# Patient Record
Sex: Female | Born: 1965 | Race: Black or African American | Hispanic: No | Marital: Single | Smoking: Former smoker
Health system: Southern US, Community
[De-identification: ages and names within clinical notes are randomized; demographics above are authoritative.]

## PROBLEM LIST (undated history)

## (undated) DIAGNOSIS — F329 Major depressive disorder, single episode, unspecified: Secondary | ICD-10-CM

## (undated) DIAGNOSIS — K5900 Constipation, unspecified: Secondary | ICD-10-CM

## (undated) DIAGNOSIS — F32A Depression, unspecified: Secondary | ICD-10-CM

## (undated) DIAGNOSIS — F419 Anxiety disorder, unspecified: Secondary | ICD-10-CM

## (undated) HISTORY — DX: Anxiety disorder, unspecified: F41.9

## (undated) HISTORY — DX: Constipation, unspecified: K59.00

## (undated) HISTORY — PX: EYE SURGERY: SHX253

## (undated) HISTORY — PX: TUBAL LIGATION: SHX77

## (undated) HISTORY — PX: ENDOMETRIAL ABLATION: SHX621

## (undated) HISTORY — DX: Depression, unspecified: F32.A

## (undated) HISTORY — DX: Major depressive disorder, single episode, unspecified: F32.9

---

## 1998-08-07 ENCOUNTER — Encounter (HOSPITAL_COMMUNITY): Admission: RE | Admit: 1998-08-07 | Discharge: 1998-09-03 | Payer: Self-pay | Admitting: Obstetrics & Gynecology

## 1998-08-19 ENCOUNTER — Encounter: Payer: Self-pay | Admitting: Obstetrics & Gynecology

## 1998-09-01 ENCOUNTER — Inpatient Hospital Stay (HOSPITAL_COMMUNITY): Admission: AD | Admit: 1998-09-01 | Discharge: 1998-09-04 | Payer: Self-pay | Admitting: Obstetrics & Gynecology

## 1998-09-10 ENCOUNTER — Encounter (HOSPITAL_COMMUNITY): Admission: RE | Admit: 1998-09-10 | Discharge: 1998-12-09 | Payer: Self-pay | Admitting: Obstetrics & Gynecology

## 1998-11-04 ENCOUNTER — Ambulatory Visit (HOSPITAL_COMMUNITY): Admission: RE | Admit: 1998-11-04 | Discharge: 1998-11-04 | Payer: Self-pay | Admitting: Obstetrics & Gynecology

## 1999-04-16 ENCOUNTER — Encounter: Admission: RE | Admit: 1999-04-16 | Discharge: 1999-04-16 | Payer: Self-pay | Admitting: Obstetrics and Gynecology

## 1999-04-16 ENCOUNTER — Encounter: Payer: Self-pay | Admitting: Obstetrics and Gynecology

## 1999-04-16 ENCOUNTER — Encounter (INDEPENDENT_AMBULATORY_CARE_PROVIDER_SITE_OTHER): Payer: Self-pay | Admitting: *Deleted

## 2000-01-14 ENCOUNTER — Other Ambulatory Visit: Admission: RE | Admit: 2000-01-14 | Discharge: 2000-01-14 | Payer: Self-pay | Admitting: Obstetrics and Gynecology

## 2000-09-30 ENCOUNTER — Ambulatory Visit (HOSPITAL_BASED_OUTPATIENT_CLINIC_OR_DEPARTMENT_OTHER): Admission: RE | Admit: 2000-09-30 | Discharge: 2000-09-30 | Payer: Self-pay | Admitting: Ophthalmology

## 2002-08-03 ENCOUNTER — Other Ambulatory Visit: Admission: RE | Admit: 2002-08-03 | Discharge: 2002-08-03 | Payer: Self-pay | Admitting: Obstetrics and Gynecology

## 2003-12-31 ENCOUNTER — Other Ambulatory Visit: Admission: RE | Admit: 2003-12-31 | Discharge: 2003-12-31 | Payer: Self-pay | Admitting: Obstetrics and Gynecology

## 2004-03-13 ENCOUNTER — Encounter (INDEPENDENT_AMBULATORY_CARE_PROVIDER_SITE_OTHER): Payer: Self-pay | Admitting: Specialist

## 2004-03-13 ENCOUNTER — Ambulatory Visit (HOSPITAL_COMMUNITY): Admission: RE | Admit: 2004-03-13 | Discharge: 2004-03-13 | Payer: Self-pay | Admitting: Obstetrics and Gynecology

## 2005-03-30 ENCOUNTER — Other Ambulatory Visit: Admission: RE | Admit: 2005-03-30 | Discharge: 2005-03-30 | Payer: Self-pay | Admitting: Obstetrics and Gynecology

## 2008-07-01 ENCOUNTER — Emergency Department (HOSPITAL_BASED_OUTPATIENT_CLINIC_OR_DEPARTMENT_OTHER): Admission: EM | Admit: 2008-07-01 | Discharge: 2008-07-01 | Payer: Self-pay | Admitting: Emergency Medicine

## 2008-10-29 ENCOUNTER — Encounter (INDEPENDENT_AMBULATORY_CARE_PROVIDER_SITE_OTHER): Payer: Self-pay | Admitting: *Deleted

## 2008-12-13 ENCOUNTER — Ambulatory Visit: Payer: Self-pay | Admitting: Family Medicine

## 2008-12-13 DIAGNOSIS — R0989 Other specified symptoms and signs involving the circulatory and respiratory systems: Secondary | ICD-10-CM | POA: Insufficient documentation

## 2008-12-13 DIAGNOSIS — K625 Hemorrhage of anus and rectum: Secondary | ICD-10-CM | POA: Insufficient documentation

## 2008-12-13 DIAGNOSIS — K5900 Constipation, unspecified: Secondary | ICD-10-CM | POA: Insufficient documentation

## 2008-12-13 DIAGNOSIS — R0609 Other forms of dyspnea: Secondary | ICD-10-CM | POA: Insufficient documentation

## 2008-12-15 DIAGNOSIS — F341 Dysthymic disorder: Secondary | ICD-10-CM | POA: Insufficient documentation

## 2008-12-17 ENCOUNTER — Telehealth: Payer: Self-pay | Admitting: Family Medicine

## 2008-12-17 LAB — CONVERTED CEMR LAB
ALT: 21 units/L (ref 0–35)
AST: 19 units/L (ref 0–37)
Albumin: 4.2 g/dL (ref 3.5–5.2)
Alkaline Phosphatase: 87 units/L (ref 39–117)
BUN: 12 mg/dL (ref 6–23)
Basophils Absolute: 0 10*3/uL (ref 0.0–0.1)
Basophils Relative: 0 % (ref 0–1)
Bilirubin, Direct: 0.1 mg/dL (ref 0.0–0.3)
CO2: 23 meq/L (ref 19–32)
Calcium: 9.1 mg/dL (ref 8.4–10.5)
Chloride: 104 meq/L (ref 96–112)
Cholesterol: 173 mg/dL (ref 0–200)
Creatinine, Ser: 0.85 mg/dL (ref 0.40–1.20)
Eosinophils Absolute: 0.1 10*3/uL (ref 0.0–0.7)
Eosinophils Relative: 2 % (ref 0–5)
Glucose, Bld: 81 mg/dL (ref 70–99)
HCT: 38.9 % (ref 36.0–46.0)
HDL: 68 mg/dL (ref >39)
Hemoglobin: 12.5 g/dL (ref 12.0–15.0)
Indirect Bilirubin: 0.3 mg/dL (ref 0.0–0.9)
LDL Cholesterol: 95 mg/dL (ref 0–99)
Lymphocytes Relative: 34 % (ref 12–46)
Lymphs Abs: 2.2 10*3/uL (ref 0.7–4.0)
MCHC: 32.1 g/dL (ref 30.0–36.0)
MCV: 84.2 fL (ref 78.0–100.0)
Monocytes Absolute: 0.5 10*3/uL (ref 0.1–1.0)
Monocytes Relative: 8 % (ref 3–12)
Neutro Abs: 3.7 10*3/uL (ref 1.7–7.7)
Neutrophils Relative %: 57 % (ref 43–77)
Platelets: 304 10*3/uL (ref 150–400)
Potassium: 4 meq/L (ref 3.5–5.3)
RBC: 4.62 M/uL (ref 3.87–5.11)
RDW: 14.5 % (ref 11.5–15.5)
Sodium: 139 meq/L (ref 135–145)
TSH: 1.622 microintl units/mL (ref 0.350–4.500)
Total Bilirubin: 0.4 mg/dL (ref 0.3–1.2)
Total CHOL/HDL Ratio: 2.5
Total Protein: 6.8 g/dL (ref 6.0–8.3)
Triglycerides: 52 mg/dL (ref <150)
VLDL: 10 mg/dL (ref 0–40)
WBC: 6.5 10*3/uL (ref 4.0–10.5)

## 2008-12-26 ENCOUNTER — Ambulatory Visit: Payer: Self-pay | Admitting: Pulmonary Disease

## 2008-12-26 DIAGNOSIS — J309 Allergic rhinitis, unspecified: Secondary | ICD-10-CM | POA: Insufficient documentation

## 2008-12-26 DIAGNOSIS — G4710 Hypersomnia, unspecified: Secondary | ICD-10-CM | POA: Insufficient documentation

## 2009-01-24 ENCOUNTER — Ambulatory Visit (HOSPITAL_BASED_OUTPATIENT_CLINIC_OR_DEPARTMENT_OTHER): Admission: RE | Admit: 2009-01-24 | Discharge: 2009-01-24 | Payer: Self-pay | Admitting: Pulmonary Disease

## 2009-01-24 ENCOUNTER — Encounter: Payer: Self-pay | Admitting: Pulmonary Disease

## 2009-02-09 ENCOUNTER — Ambulatory Visit: Payer: Self-pay | Admitting: Pulmonary Disease

## 2009-02-12 ENCOUNTER — Telehealth (INDEPENDENT_AMBULATORY_CARE_PROVIDER_SITE_OTHER): Payer: Self-pay | Admitting: *Deleted

## 2009-03-06 ENCOUNTER — Encounter: Payer: Self-pay | Admitting: Internal Medicine

## 2009-03-06 ENCOUNTER — Ambulatory Visit: Payer: Self-pay | Admitting: Pulmonary Disease

## 2009-03-06 DIAGNOSIS — Z9989 Dependence on other enabling machines and devices: Secondary | ICD-10-CM

## 2009-03-06 DIAGNOSIS — G4733 Obstructive sleep apnea (adult) (pediatric): Secondary | ICD-10-CM | POA: Insufficient documentation

## 2010-07-16 LAB — URINALYSIS, ROUTINE W REFLEX MICROSCOPIC
Glucose, UA: NEGATIVE mg/dL
Hgb urine dipstick: NEGATIVE
Ketones, ur: 40 mg/dL — AB
Nitrite: NEGATIVE
Protein, ur: NEGATIVE mg/dL
Specific Gravity, Urine: 1.028 (ref 1.005–1.030)
Urobilinogen, UA: 0.2 mg/dL (ref 0.0–1.0)
pH: 5.5 (ref 5.0–8.0)

## 2010-07-16 LAB — CBC
HCT: 42.1 % (ref 36.0–46.0)
Platelets: 269 10*3/uL (ref 150–400)
RDW: 13.6 % (ref 11.5–15.5)
WBC: 4.5 10*3/uL (ref 4.0–10.5)

## 2010-07-16 LAB — COMPREHENSIVE METABOLIC PANEL
ALT: 15 U/L (ref 0–35)
AST: 25 U/L (ref 0–37)
Albumin: 3.8 g/dL (ref 3.5–5.2)
Alkaline Phosphatase: 115 U/L (ref 39–117)
Chloride: 107 mEq/L (ref 96–112)
Potassium: 3.7 mEq/L (ref 3.5–5.1)
Sodium: 138 mEq/L (ref 135–145)
Total Bilirubin: 0.5 mg/dL (ref 0.3–1.2)
Total Protein: 7.1 g/dL (ref 6.0–8.3)

## 2010-07-16 LAB — URINE CULTURE

## 2010-07-16 LAB — DIFFERENTIAL
Basophils Absolute: 0 10*3/uL (ref 0.0–0.1)
Basophils Relative: 0 % (ref 0–1)
Eosinophils Absolute: 0 10*3/uL (ref 0.0–0.7)
Eosinophils Relative: 1 % (ref 0–5)
Monocytes Absolute: 0.4 10*3/uL (ref 0.1–1.0)
Monocytes Relative: 9 % (ref 3–12)

## 2010-08-21 NOTE — Op Note (Signed)
NAMEAYMARA, SASSI               ACCOUNT NO.:  [id number]   MEDICAL RECORD NO.:  [id number]          PATIENT TYPE:  AMB   LOCATION:  SDC                           FACILITY:  [facility]   PHYSICIAN:  Michelle L. Grewal, M.D.DATE OF BIRTH:  [birth year]   DATE OF PROCEDURE:  03/13/2004  DATE OF DISCHARGE:                                 OPERATIVE REPORT   PREOPERATIVE DIAGNOSIS:  Menorrhagia and endometrial polyp.   POSTOPERATIVE DIAGNOSIS:  Menorrhagia and endometrial polyp.   PROCEDURE:  Dilatation and curettage, hysteroscopy, and ThermaChoice  endometrial ablation.   SURGEON:  Michelle L. Vincente Poli, M.D.   ANESTHESIA:  LMA with paracervical block and no complications.   DESCRIPTION OF PROCEDURE:  The patient was taken to the operating room where  she was given anesthesia.  She was prepped and draped in the usual sterile  fashion.  In-and-out catheter was used to empty the bladder.  A speculum was  inserted into the vagina.  The cervix was grasped with a tenaculum and the  uterus was sounded to 9 cm and was noted to be in the midline.  The cervical  internal os was gently dilated using Pratt dilators.  The diagnostic  hysteroscope was inserted and with good distention and good visualization,  the endometrium was noted to be very shaggy with some blood clots.  There  was no distinct polyp seen, but the patient is currently on her period.  The  hysteroscope was removed and a thorough sharp curettage was performed of all  four walls of the uterus.  The ThermaChoice 3 balloon was inserted and the  ThermaChoice endometrial ablation was performed according to the  manufacturer's specifications.  The intact balloon was removed.  There was  no uterine bleeding noted.  All instruments were removed from the vagina.  All needle, sponge, and instrument counts correct x2.  The patient went to  the recovery room in stable condition.     [place]   MLG/MEDQ  D:  03/13/2004  T:  03/13/2004  Job:   [id number]

## 2010-08-21 NOTE — Op Note (Signed)
[place]  Patient:    Leah Jimenez, Leah Jimenez                      MRN: [id number] Proc. Date: 09/30/00 Adm. Date:  [id number] Attending:  Shara Blazing                           Operative Report  PREOPERATIVE DIAGNOSIS:  Intermittent exotropia.  POSTOPERATIVE DIAGNOSIS:  Intermittent exotropia.  PROCEDURE:  Lateral rectus muscle recession; 9.0 mm OD, 8.0 mm OS.  SURGEON:  Pasty Spillers. Maple Hudson, M.D.  ANESTHESIA:  General (laryngeal mask).  COMPLICATIONS:  None.  DESCRIPTION OF PROCEDURE:  After routine preoperative evaluation including informed consent, the patient was taken to the operating room where she was identified by me.  General anesthesia was induced without difficulty after placement of appropriate monitors.  The patient was prepped and draped in the standard sterile fashion.  A lid speculum was placed in the right eye.  Through an inferotemporal fornix incision through conjunctiva and ______, the right lateral rectus muscle was engaged on a series of muscle hooks and carefully cleared of its surrounding fascial attachment.  The tendon was secured with a double-arm 6-0 Vicryl suture, with a double-locking ______ border of the muscle.  The muscle was disinserted from the globe using Westcott scissors and was reattached to the sclera in a measured distance of 9.0 mm posterior at the operative insertion using direct scleral path and then crossed in a ______ fashion.  The suture ends were tied securely after the position of the muscle was checked and was found to be accurate.  The conjunctiva was closed with two interrupted 6-0 plain gut sutures.  The lid speculum was transferred to the left eye, where an identical procedure was performed except the lateral rectus muscle was recessed 8.5 mm instead of 9.0 mm.  TobraDex ointment was placed in each eye.  The patient was awakened without difficulty and taken to the recovery room in stable  condition after having suffered no intraoperative or immediate postoperative complications. DD:  09/30/00 TD:  09/30/00 Job: [id number]

## 2011-12-20 ENCOUNTER — Other Ambulatory Visit: Payer: Self-pay | Admitting: Obstetrics and Gynecology

## 2013-04-17 ENCOUNTER — Ambulatory Visit (INDEPENDENT_AMBULATORY_CARE_PROVIDER_SITE_OTHER): Payer: BC Managed Care – PPO | Admitting: Emergency Medicine

## 2013-04-17 VITALS — BP 130/80 | HR 83 | Temp 98.2°F | Resp 16 | Ht 62.5 in | Wt 161.0 lb

## 2013-04-17 DIAGNOSIS — J209 Acute bronchitis, unspecified: Secondary | ICD-10-CM

## 2013-04-17 DIAGNOSIS — R059 Cough, unspecified: Secondary | ICD-10-CM

## 2013-04-17 DIAGNOSIS — R05 Cough: Secondary | ICD-10-CM

## 2013-04-17 MED ORDER — GUAIFENESIN-DM 100-10 MG/5ML PO SYRP: 5.0000 mL | ORAL_SOLUTION | ORAL | Status: DC | PRN

## 2013-04-17 MED ORDER — AZITHROMYCIN 250 MG PO TABS: ORAL_TABLET | ORAL | Status: DC

## 2013-04-17 NOTE — Patient Instructions (Signed)

## 2013-04-17 NOTE — Progress Notes (Signed)
[facility] [address], [place] [address] [phone]  Date:  04/17/2013   Name:  Leah RalphsSharon H Jimenez   DOB:  September 25, 1965   MRN:  [id number]  PCP:  Loreen FreudYvonne Lowne, DO    Chief Complaint: URI   History of Present Illness:  Leah Jimenez is a 48 y.o. very pleasant female patient who presents with the following:  Ill since Friday with clear nasal drainage and congestion.  Says ears congested. No fever or chills.  Cough with no wheezing or shortness of breath.  Has purulent sputum.  No nausea or vomiting.  No stool change or rash.  Malaise and arthralgias.  Fatigue.  No improvement with over the counter medications or other home remedies. Denies other complaint or health concern today.   Patient Active Problem List   Diagnosis Date Noted  . OBSTRUCTIVE SLEEP APNEA 03/06/2009  . ALLERGIC RHINITIS 12/26/2008  . HYPERSOMNIA 12/26/2008  . DEPRESSION 12/15/2008  . CONSTIPATION 12/13/2008  . RECTAL BLEEDING 12/13/2008  . SNORING 12/13/2008    History reviewed. No pertinent past medical history.  Past Surgical History  Procedure Laterality Date  . Eye surgery    . Tubal ligation      History  Substance Use Topics  . Smoking status: Never Smoker   . Smokeless tobacco: Not on file  . Alcohol Use: No    Family History  Problem Relation Age of Onset  . Stroke Mother   . High Cholesterol Mother   . Stroke Father   . High Cholesterol Father   . High Cholesterol Sister     No Known Allergies  Medication list has been reviewed and updated.  No current outpatient prescriptions on file prior to visit.   No current facility-administered medications on file prior to visit.    Review of Systems:  As per HPI, otherwise negative.    Physical Examination: Filed Vitals:   04/17/13 1157  BP: 130/80  Pulse: 83  Temp: 98.2 F (36.8 C)  Resp: 16   Filed Vitals:   04/17/13 1157  Height: 5' 2.5" (1.588 m)  Weight: 161 lb (73.029 kg)   Body mass  index is 28.96 kg/(m^2). Ideal Body Weight: Weight in (lb) to have BMI = 25: 138.6  GEN: WDWN, NAD, Non-toxic, A & O x 3 HEENT: Atraumatic, Normocephalic. Neck supple. No masses, No LAD. Ears and Nose: No external deformity. CV: RRR, No M/G/R. No JVD. No thrill. No extra heart sounds. PULM: CTA B, no wheezes, crackles, rhonchi. No retractions. No resp. distress. No accessory muscle use. ABD: S, NT, ND, +BS. No rebound. No HSM. EXTR: No c/c/e NEURO Normal gait.  PSYCH: Normally interactive. Conversant. Not depressed or anxious appearing.  Calm demeanor.    Assessment and Plan: Bronchitis zpak  Signed,  Phillips OdorJeffery Anderson, MD

## 2013-06-12 ENCOUNTER — Other Ambulatory Visit: Payer: Self-pay | Admitting: Obstetrics and Gynecology

## 2014-09-27 ENCOUNTER — Other Ambulatory Visit: Payer: Self-pay | Admitting: Obstetrics and Gynecology

## 2014-09-30 LAB — CYTOLOGY - PAP

## 2015-10-01 ENCOUNTER — Telehealth: Payer: Self-pay

## 2015-10-01 ENCOUNTER — Ambulatory Visit (INDEPENDENT_AMBULATORY_CARE_PROVIDER_SITE_OTHER): Payer: BLUE CROSS/BLUE SHIELD | Admitting: Family Medicine

## 2015-10-01 ENCOUNTER — Encounter: Payer: Self-pay | Admitting: Family Medicine

## 2015-10-01 VITALS — BP 127/79 | HR 73 | Ht 63.25 in | Wt 170.2 lb

## 2015-10-01 DIAGNOSIS — E559 Vitamin D deficiency, unspecified: Secondary | ICD-10-CM | POA: Diagnosis not present

## 2015-10-01 DIAGNOSIS — I10 Essential (primary) hypertension: Secondary | ICD-10-CM | POA: Diagnosis not present

## 2015-10-01 DIAGNOSIS — F341 Dysthymic disorder: Secondary | ICD-10-CM | POA: Diagnosis not present

## 2015-10-01 DIAGNOSIS — E663 Overweight: Secondary | ICD-10-CM | POA: Diagnosis not present

## 2015-10-01 MED ORDER — FLUOXETINE HCL (PMDD) 20 MG PO TABS: ORAL_TABLET | ORAL | Status: DC

## 2015-10-01 NOTE — Assessment & Plan Note (Signed)
We did discuss that her excess weight can be contributing to various joint aches and pains that she may have from time to time in that weight loss would help with her hypertension. Exercise would also help her physically and mentally.  Discussed Mediterranean diet today.

## 2015-10-01 NOTE — Assessment & Plan Note (Signed)
Well controlled currently. Continue current medications. We'll obtain fasting labs in the near future.

## 2015-10-01 NOTE — Progress Notes (Signed)
Leah Jimenez, D.O. [facility]  Subjective:    CC: New pt, here to establish care.   HPI: Leah RalphsSharon H Jimenez is a pleasant 50 y.o. female who [facility] To transfer her care here. She prior had seen Dr. Salomon MastMeg [facility] for her GYN and general medical care. She was recently advised to get a primary care provider in addition. [facility].    Patient is happily married and has 3 children, a set of twins at 51 years old and one older daughter at 9320 who is a sophomore ENT. 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 She's had a difficult time and has been emotionally challenging since then. 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  But her getting laid off in 2014 has taken an emotional toll on her.  Been on fluoxetine since 2006 for perimenopausal symptoms but now feels she needs it increased for emotional lability. Tearful at times and her "nerves" are not good anymore. She can become very anxious driving in the car to the point where her husband tells her she needs to drink at the end of a three-hour ride because she can get herself so worked up.  Relative inactivity. Patient is worried because she has a family history of hypertension, hyperlipidemia and diabetes in her sister who is one year older than her.   She would like her yearly screening exams which she has not had in a long time. She has never had a colonoscopy.  Blood pressure: She has been on Maxzide for a couple years now. She takes that help with fluid in her legs as well. What pressures been well controlled. She is asymptomatic.     Past Medical History  Diagnosis Date  . Depression   . Sleep apnea   . Allergy   . Constipation   . Rectal bleeding   . Hypersomnia   . Snoring     Past Surgical History  Procedure  Laterality Date  . Eye surgery    . Tubal ligation    . Endometrial ablation      Family History  Problem Relation Age of Onset  . Stroke Mother   . High Cholesterol Mother   . Hypertension Mother   . Stroke Father   . High Cholesterol Father   . Hypertension Father   . High Cholesterol Sister     History  Drug Use No  ,  History  Alcohol Use  . 1.8 oz/week  . 3 Cans of beer per week  ,  History  Smoking status  . Former Smoker  Smokeless tobacco  . Never Used  ,  History  Sexual Activity  . Sexual Activity: Yes    Patient's Medications  New Prescriptions   No medications on file  Previous Medications   FLUOXETINE HCL, PMDD, (SARAFEM) 20 MG TABS    Take by mouth.   TRIAMTERENE-HYDROCHLOROTHIAZIDE (MAXZIDE) 75-50 MG TABLET    Take 1 tablet by mouth daily.  Modified Medications   No medications on file  Discontinued Medications   AZITHROMYCIN (ZITHROMAX) 250 MG TABLET    Take 2 tabs PO x 1 dose, then 1 tab PO QD x 4 days   GUAIFENESIN-DEXTROMETHORPHAN (ROBITUSSIN DM) 100-10 MG/5ML SYRUP  Take 5 mLs by mouth every 4 (four) hours as needed for cough.   TRIAMTERENE PO    Take by mouth.    ALLERGIES: Review of patient's allergies indicates no known allergies.  Review of Systems:   ( Completed via her Adult Medical History Intake form today ) General:   Denies fever, chills, appetite changes, unexplained weight loss.  Optho/Auditory:   Denies visual changes, blurred vision/LOV, ringing in ears/ diff hearing Respiratory:   Denies SOB, DOE, cough, wheezing.  Cardiovascular:   Denies chest pain, palpitations, new onset peripheral edema  Gastrointestinal:   Denies nausea, vomiting, diarrhea.  Genitourinary:    Denies dysuria, increased frequency, flank pain.  Endocrine:     Denies hot or cold intolerance, polyuria, polydipsia. Musculoskeletal:  Denies unexplained myalgias, joint swelling, arthralgias, gait problems.  Skin:  Denies rash, suspicious lesions or new/  changes in moles Neurological:    Denies dizziness, syncope, unexplained weakness, lightheadedness, numbness  Psychiatric/Behavioral:   Denies mood changes, suicidal or homicidal ideations, hallucinations    Objective:   Blood pressure 127/79, pulse 73, height 5' 3.25" (1.607 m), weight 170 lb 3.2 oz (77.202 kg), last menstrual period 09/25/2015. Body mass index is 29.89 kg/(m^2).  General: Well Developed, well nourished, and in no acute distress.  Neuro: Alert and oriented x3, extra-ocular muscles intact, sensation grossly intact.  HEENT: Normocephalic, atraumatic, pupils equal round reactive to light, neck supple, no gross masses, no carotid bruits, no JVD apprec Skin: no gross suspicious lesions or rashes  Cardiac: Regular rate and rhythm, no murmurs rubs or gallops.  Respiratory: Essentially clear to auscultation bilaterally. Not using accessory muscles, speaking in full sentences.  Abdominal: Soft, not grossly distended Musculoskeletal: Ambulates w/o diff, FROM * 4 ext.  Vasc: less 2 sec cap RF, warm and pink  Psych:  No HI/SI, judgement and insight good.    Impression and Recommendations:     Pt was seen in the office today for 45+ minutes, with over 50% time spent in face the face counseling and coordination of care re: pt's emotional state.  The patient was counselled, risk factors were discussed, anticipatory guidance given.  Vitamin D insufficiency Patient used to take vitamin D long ago but was lost to follow-up and not sure if she still needs it.  HTN (hypertension) Well controlled currently. Continue current medications. We'll obtain fasting labs in the near future.  Dysthymia We'll increase her Prozac from 20 mg to 30 or 40 per day.  Over the next couple of weeks she will see if 30 or 40 per day is controlling her symptoms well. I encouraged her to engage in healthy lifestyle habits and to start exercise regimen of at least 20 minutes per day.    She used to walk 5  miles a day couple years ago but stopped because of some leg pain that she had at that time.  Overweight (BMI 25.0-29.9) We did discuss that her excess weight can be contributing to various joint aches and pains that she may have from time to time in that weight loss would help with her hypertension. Exercise would also help her physically and mentally.  Discussed Mediterranean diet today.  See the AVS worksheet for further instructions given to patient at this visit   Gross side effects, risk and benefits, and alternatives of medications discussed with patient.  Patient is aware that all medications have potential side effects and we are unable to predict every sideeffect or drug-drug interaction that may occur.  Expresses verbal understanding and consents to current therapy plan and treatment regiment.  Note: This document was prepared using Dragon voice recognition software and may include unintentional dictation errors.

## 2015-10-01 NOTE — Telephone Encounter (Signed)
Received call from Leah F[place] Jimenez, [place] at [facility] stating that the pt's insurance will cover tablets, taking 3-4 once daily.  Pharmacy requests change to tablet form.  Authorization granted.  Leah Jimenez. Lavine Hargrove, CMA

## 2015-10-01 NOTE — Patient Instructions (Addendum)
-  I n the near future return to clinic for fasting blood work. Then one week later, coming to office to discuss results with me.  - Patient will ask her husband who he uses for his colonoscopies and we will put in the referral next office visit.  - walk 15- 20 min 5 days per week    Adjustment Disorder Adjustment disorder is an unusually severe reaction to a stressful life event, such as the loss of a job or physical illness. The event may be any stressful event other than the loss of a loved one. Adjustment disorder may affect your feelings, your thinking, how you act, or a combination of these. It may interfere with personal relationships or with the way you are at work, school, or home. People with this disorder are at risk for suicide and substance abuse. They may develop a more serious mental disorder, such as major depressive disorder or post-traumatic stress disorder. SIGNS AND SYMPTOMS  Symptoms may include:  Sadness, depressed mood, or crying spells.  Loss of enjoyment.  Change in appetite or weight.  Sense of loss or hopelessness.  Thoughts of suicide.  Anxiety, worry, or nervousness.  Trouble sleeping.  Avoiding family and friends.  Poor school performance.  Fighting or vandalism.  Reckless driving.  Skipping school.  Poor work International aid/development workerperformance.  Ignoring bills. Symptoms of adjustment disorder start within 3 months of the stressful life event. They do not last more than 6 months after the event has ended. DIAGNOSIS  To make a diagnosis, your health care provider will ask about what has happened in your life and how it has affected you. He or she may also ask about your medical history and use of medicines, alcohol, and other substances. Your health care provider may do a physical exam and order lab tests or other studies. You may be referred to a mental health specialist for evaluation. TREATMENT  Treatment options include:  Counseling or talk therapy. Talk therapy  is usually provided by mental health specialists.  Medicine. Certain medicines may help with depression, anxiety, and sleep.  Support groups. Support groups offer emotional support, advice, and guidance. They are made up of people who have had similar experiences. HOME CARE INSTRUCTIONS  Keep all follow-up visits as directed by your health care provider. This is important.  Take medicines only as directed by your health care provider. SEEK MEDICAL CARE IF:  Your symptoms get worse.  SEEK IMMEDIATE MEDICAL CARE IF: You have serious thoughts about hurting yourself or someone else. MAKE SURE YOU:  Understand these instructions.  Will watch your condition.  Will get help right away if you are not doing well or get worse.   This information is not intended to replace advice given to you by your health care provider. Make sure you discuss any questions you have with your health care provider.   Document Released: 11/24/2005 Document Revised: 04/12/2014 Document Reviewed: 08/14/2013 Elsevier Interactive Patient Education [employer] Elsevier Inc.

## 2015-10-01 NOTE — Assessment & Plan Note (Signed)
We'll increase her Prozac from 20 mg to 30 or 40 per day.  Over the next couple of weeks she will see if 30 or 40 per day is controlling her symptoms well. I encouraged her to engage in healthy lifestyle habits and to start exercise regimen of at least 20 minutes per day.    She used to walk 5 miles a day couple years ago but stopped because of some leg pain that she had at that time.

## 2015-10-01 NOTE — Assessment & Plan Note (Signed)
Patient used to take vitamin D long ago but was lost to follow-up and not sure if she still needs it.

## 2015-10-14 ENCOUNTER — Other Ambulatory Visit: Payer: Self-pay

## 2015-10-14 DIAGNOSIS — I10 Essential (primary) hypertension: Secondary | ICD-10-CM

## 2015-10-14 DIAGNOSIS — E559 Vitamin D deficiency, unspecified: Secondary | ICD-10-CM

## 2015-10-14 DIAGNOSIS — G4710 Hypersomnia, unspecified: Secondary | ICD-10-CM

## 2015-10-14 DIAGNOSIS — E663 Overweight: Secondary | ICD-10-CM

## 2015-10-15 ENCOUNTER — Other Ambulatory Visit (INDEPENDENT_AMBULATORY_CARE_PROVIDER_SITE_OTHER): Payer: BLUE CROSS/BLUE SHIELD

## 2015-10-15 DIAGNOSIS — I10 Essential (primary) hypertension: Secondary | ICD-10-CM

## 2015-10-15 DIAGNOSIS — E559 Vitamin D deficiency, unspecified: Secondary | ICD-10-CM

## 2015-10-15 DIAGNOSIS — E663 Overweight: Secondary | ICD-10-CM

## 2015-10-16 LAB — COMPREHENSIVE METABOLIC PANEL
ALK PHOS: 101 U/L (ref 33–130)
ALT: 16 U/L (ref 6–29)
AST: 18 U/L (ref 10–35)
Albumin: 4.4 g/dL (ref 3.6–5.1)
BILIRUBIN TOTAL: 0.3 mg/dL (ref 0.2–1.2)
BUN: 17 mg/dL (ref 7–25)
CO2: 30 mmol/L (ref 20–31)
Calcium: 9.7 mg/dL (ref 8.6–10.4)
Chloride: 101 mmol/L (ref 98–110)
Creat: 1.03 mg/dL (ref 0.50–1.05)
GLUCOSE: 118 mg/dL — AB (ref 65–99)
Potassium: 4.3 mmol/L (ref 3.5–5.3)
Sodium: 138 mmol/L (ref 135–146)
Total Protein: 7.1 g/dL (ref 6.1–8.1)

## 2015-10-16 LAB — LIPID PANEL
CHOL/HDL RATIO: 3.2 ratio (ref <=5.0)
Cholesterol: 245 mg/dL — ABNORMAL HIGH (ref 125–200)
HDL: 76 mg/dL (ref >=46)
LDL Cholesterol: 149 mg/dL — ABNORMAL HIGH (ref <130)
TRIGLYCERIDES: 102 mg/dL (ref <150)
VLDL: 20 mg/dL (ref <30)

## 2015-10-16 LAB — CBC WITH DIFFERENTIAL/PLATELET
BASOS ABS: 0 {cells}/uL (ref 0–200)
Basophils Relative: 0 %
EOS PCT: 2 %
Eosinophils Absolute: 92 cells/uL (ref 15–500)
HCT: 40.6 % (ref 35.0–45.0)
Hemoglobin: 13.3 g/dL (ref 11.7–15.5)
Lymphocytes Relative: 37 %
Lymphs Abs: 1702 cells/uL (ref 850–3900)
MCH: 26.9 pg — AB (ref 27.0–33.0)
MCHC: 32.8 g/dL (ref 32.0–36.0)
MCV: 82 fL (ref 80.0–100.0)
MONOS PCT: 10 %
MPV: 9.3 fL (ref 7.5–12.5)
Monocytes Absolute: 460 cells/uL (ref 200–950)
NEUTROS PCT: 51 %
Neutro Abs: 2346 cells/uL (ref 1500–7800)
PLATELETS: 350 10*3/uL (ref 140–400)
RBC: 4.95 MIL/uL (ref 3.80–5.10)
RDW: 15.6 % — ABNORMAL HIGH (ref 11.0–15.0)
WBC: 4.6 10*3/uL (ref 3.8–10.8)

## 2015-10-16 LAB — VITAMIN D 25 HYDROXY (VIT D DEFICIENCY, FRACTURES): VIT D 25 HYDROXY: 17 ng/mL — AB (ref 30–100)

## 2015-10-16 LAB — HEMOGLOBIN A1C
Hgb A1c MFr Bld: 6.2 % — ABNORMAL HIGH (ref <5.7)
Mean Plasma Glucose: 131 mg/dL

## 2015-10-16 LAB — TSH: TSH: 2.65 mIU/L

## 2015-10-22 ENCOUNTER — Encounter: Payer: Self-pay | Admitting: Family Medicine

## 2015-10-22 ENCOUNTER — Ambulatory Visit (INDEPENDENT_AMBULATORY_CARE_PROVIDER_SITE_OTHER): Payer: BLUE CROSS/BLUE SHIELD | Admitting: Family Medicine

## 2015-10-22 VITALS — BP 119/78 | HR 71 | Ht 63.25 in | Wt 160.0 lb

## 2015-10-22 DIAGNOSIS — E559 Vitamin D deficiency, unspecified: Secondary | ICD-10-CM | POA: Diagnosis not present

## 2015-10-22 DIAGNOSIS — I10 Essential (primary) hypertension: Secondary | ICD-10-CM

## 2015-10-22 DIAGNOSIS — E785 Hyperlipidemia, unspecified: Secondary | ICD-10-CM

## 2015-10-22 DIAGNOSIS — R7303 Prediabetes: Secondary | ICD-10-CM | POA: Insufficient documentation

## 2015-10-22 DIAGNOSIS — E663 Overweight: Secondary | ICD-10-CM | POA: Diagnosis not present

## 2015-10-22 DIAGNOSIS — G4733 Obstructive sleep apnea (adult) (pediatric): Secondary | ICD-10-CM

## 2015-10-22 DIAGNOSIS — F341 Dysthymic disorder: Secondary | ICD-10-CM

## 2015-10-22 DIAGNOSIS — Z9989 Dependence on other enabling machines and devices: Secondary | ICD-10-CM

## 2015-10-22 MED ORDER — VITAMIN D (ERGOCALCIFEROL) 1.25 MG (50000 UNIT) PO CAPS: 50000.0000 [IU] | ORAL_CAPSULE | ORAL | Status: DC

## 2015-10-22 MED ORDER — VITAMIN D3 125 MCG (5000 UT) PO TABS: ORAL_TABLET | ORAL | Status: DC

## 2015-10-22 NOTE — Assessment & Plan Note (Signed)
Patient declines this ever been a problem in the past. Significant counseling performed. Handouts provided. Next  Her 10 year prescribed a cardiovascular risk is 2.2%. She will engage in intensive diet and exercise regimen and we will recheck this in 6 months.

## 2015-10-22 NOTE — Assessment & Plan Note (Signed)
Advise 50 K weekly (prescription sent) and 5K daily over-the-counter. Recheck 4-6 months.  This is been a chronic ongoing issue for patient for many years.

## 2015-10-22 NOTE — Assessment & Plan Note (Addendum)
Continue Prozac.  Tolerating well  Patient received a 20 mg prescription from her OB/GYN Dr. Milton FergusonGraywall end of June and when she saw me we added a 10 mg tablet so patient could start with 30 and possibly increased to 40 mg per day.    Continue healthy lifestyle changes to help manage her stress.  Senior in college and is looking forward to next chapter of her life.

## 2015-10-22 NOTE — Assessment & Plan Note (Signed)
Advised patient follow-up with pulmonary to assess her machine and ensure it is at proper setting.

## 2015-10-22 NOTE — Progress Notes (Signed)
Subjective:    Chief Complaint  Patient presents with  . Results    HPI: Leah Jimenez is a 50 y.o. female who presents to [place] at [facility] today To discuss lab work.  Started with friend wlaking 3 mi daily ever since last office visit. No physical symptoms and doing well with it. Feels better emotionally and physically.  Water: 3 glasses per day.   Constipation:  Dialysis from time to time and always worse in summer.    Past Medical History  Diagnosis Date  . Depression   . Constipation      Past Surgical History  Procedure Laterality Date  . Eye surgery    . Tubal ligation    . Endometrial ablation       Family History  Problem Relation Age of Onset  . Stroke Mother   . High Cholesterol Mother   . Hypertension Mother   . Stroke Father   . High Cholesterol Father   . Hypertension Father   . High Cholesterol Sister      History  Drug Use No  ,  History  Alcohol Use  . 1.8 oz/week  . 3 Cans of beer per week  ,  History  Smoking status  . Former Smoker  Smokeless tobacco  . Never Used  ,  History  Sexual Activity  . Sexual Activity: Yes      Current Outpatient Prescriptions on File Prior to Visit  Medication Sig Dispense Refill  . triamterene-hydrochlorothiazide (MAXZIDE) 75-50 MG tablet Take 1 tablet by mouth daily.  12   No current facility-administered medications on file prior to visit.    No Known Allergies    Review of Systems:  ( Completed via adult medical history intake form today ) General:  Denies fever, chills, appetite changes, unexplained weight loss.  Respiratory: Denies SOB, DOE, cough, wheezing.  Cardiovascular: Denies chest pain, palpitations.  Gastrointestinal: Denies nausea, vomiting, diarrhea, abdominal pain.  Genitourinary: Denies dysuria, increased frequency, flank pain. Endocrine: Denies hot or cold intolerance, polyuria, polydipsia. Musculoskeletal: Denies myalgias, back pain,  joint swelling, arthralgias, gait problems.  Skin: Denies pallor, rash, suspicious lesions.  Neurological: Denies dizziness, seizures, syncope, unexplained weakness, lightheadedness, numbness and headaches.  Psychiatric/Behavioral: Denies mood changes, suicidal or homicidal ideations, hallucinations, sleep disturbances.    Objective:    Blood pressure 119/78, pulse 71, height 5' 3.25" (1.607 m), weight 160 lb (72.576 kg), last menstrual period 09/25/2015. Body mass index is 28.1 kg/(m^2). General: Well Developed, well nourished, and in no acute distress.  HEENT: Normocephalic, atraumatic, pupils equal round reactive to light Skin: Warm and dry, cap RF less 2 sec Cardiac: Regular rate and rhythm, S1, S2 WNL's, no murmurs rubs or gallops Respiratory: ECTA B/L, Not using accessory muscles, speaking in full sentences. NeuroM-Sk: Ambulates w/o assistance, moves ext * 4 w/o difficulty, sensation grossly intact.  Psych: A and O *3, judgement and insight good.       Recent Results (from the past 2160 hour(s))  CBC with Differential/Platelet     Status: Abnormal   Collection Time: 10/15/15  8:25 AM  Result Value Ref Range   WBC 4.6 3.8 - 10.8 K/uL   RBC 4.95 3.80 - 5.10 MIL/uL   Hemoglobin 13.3 11.7 - 15.5 g/dL   HCT 40.6 35.0 - 45.0 %   MCV 82.0 80.0 - 100.0 fL   MCH 26.9 (L) 27.0 - 33.0 pg   MCHC 32.8 32.0 -  36.0 g/dL   RDW 15.6 (H) 11.0 - 15.0 %   Platelets 350 140 - 400 K/uL   MPV 9.3 7.5 - 12.5 fL   Neutro Abs 2346 1500 - 7800 cells/uL   Lymphs Abs 1702 850 - 3900 cells/uL   Monocytes Absolute 460 200 - 950 cells/uL   Eosinophils Absolute 92 15 - 500 cells/uL   Basophils Absolute 0 0 - 200 cells/uL   Neutrophils Relative % 51 %   Lymphocytes Relative 37 %   Monocytes Relative 10 %   Eosinophils Relative 2 %   Basophils Relative 0 %   Smear Review Criteria for review not met     Comment: ** Please note change in unit of measure and reference range(s). **  Comprehensive  metabolic panel     Status: Abnormal   Collection Time: 10/15/15  8:25 AM  Result Value Ref Range   Sodium 138 135 - 146 mmol/L   Potassium 4.3 3.5 - 5.3 mmol/L   Chloride 101 98 - 110 mmol/L   CO2 30 20 - 31 mmol/L   Glucose, Bld 118 (H) 65 - 99 mg/dL   BUN 17 7 - 25 mg/dL   Creat 1.03 0.50 - 1.05 mg/dL    Comment:   For patients > or = 51 years of age: The upper reference limit for Creatinine is approximately 13% higher for people identified as African-American.      Total Bilirubin 0.3 0.2 - 1.2 mg/dL   Alkaline Phosphatase 101 33 - 130 U/L   AST 18 10 - 35 U/L   ALT 16 6 - 29 U/L   Total Protein 7.1 6.1 - 8.1 g/dL   Albumin 4.4 3.6 - 5.1 g/dL   Calcium 9.7 8.6 - 10.4 mg/dL  TSH     Status: None   Collection Time: 10/15/15  8:25 AM  Result Value Ref Range   TSH 2.65 mIU/L    Comment:   Reference Range   > or = 20 Years  0.40-4.50   Pregnancy Range First trimester  0.26-2.66 Second trimester 0.55-2.73 Third trimester  0.43-2.91     Hemoglobin A1c     Status: Abnormal   Collection Time: 10/15/15  8:25 AM  Result Value Ref Range   Hgb A1c MFr Bld 6.2 (H) <5.7 %    Comment:   For someone without known diabetes, a hemoglobin A1c value between 5.7% and 6.4% is consistent with prediabetes and should be confirmed with a follow-up test.   For someone with known diabetes, a value <7% indicates that their diabetes is well controlled. A1c targets should be individualized based on duration of diabetes, age, co-morbid conditions and other considerations.   This assay result is consistent with an increased risk of diabetes.   Currently, no consensus exists regarding use of hemoglobin A1c for diagnosis of diabetes in children.      Mean Plasma Glucose 131 mg/dL  VITAMIN D 25 Hydroxy (Vit-D Deficiency, Fractures)     Status: Abnormal   Collection Time: 10/15/15  8:25 AM  Result Value Ref Range   Vit D, 25-Hydroxy 17 (L) 30 - 100 ng/mL    Comment: Vitamin D Status            25-OH Vitamin D        Deficiency                <20 ng/mL        Insufficiency  20 - 29 ng/mL        Optimal             > or = 30 ng/mL   For 25-OH Vitamin D testing on patients on D2-supplementation and patients for whom quantitation of D2 and D3 fractions is required, the QuestAssureD 25-OH VIT D, (D2,D3), LC/MS/MS is recommended: order code [id number] (patients > 2 yrs).   Lipid panel     Status: Abnormal   Collection Time: 10/15/15  8:25 AM  Result Value Ref Range   Cholesterol 245 (H) 125 - 200 mg/dL   Triglycerides 102 <150 mg/dL   HDL 76 >=46 mg/dL   Total CHOL/HDL Ratio 3.2 <=5.0 Ratio   VLDL 20 <30 mg/dL   LDL Cholesterol 149 (H) <130 mg/dL    Comment:   Total Cholesterol/HDL Ratio:CHD Risk                        Coronary Heart Disease Risk Table                                        Men       Women          1/2 Average Risk              3.4        3.3              Average Risk              5.0        4.4           2X Average Risk              9.6        7.1           3X Average Risk             23.4       11.0 Use the calculated Patient Ratio above and the CHD Risk table  to determine the patient's CHD Risk.         Impression and Recommendations:    The patient was counselled, risk factors were discussed, anticipatory guidance given.   Prediabetes This is new onset for patient. She's never had elevated A1c in the past. Last time this was checked was back in 2012 at her work.  Discussion of low-carb diet, increase activity level. Advised call insurance to see which glucometer they recommended and to check fasting blood sugars.  Advise recheck in 3-4 months.    Essential hypertension Well-controlled. Continue current medications  Overweight (BMI 25.0-29.9) Healthy eating, moving more, tract diet when necessary. Follow-up sooner than planned if needs guidance.  OSA on CPAP Advised patient follow-up with pulmonary to assess her machine and  ensure it is at proper setting.  Constipation Drink half your weight in ounces water, improved gut motility through increasing activity. MiraLAX daily and increase fiber in diet.  Vitamin D deficiency Advise 45 K weekly (prescription sent) and 5K daily over-the-counter. Recheck 4-6 months.  This is been a chronic ongoing issue for patient for many years.  Dysthymia Continue Prozac.  Tolerating well  Patient received a 20 mg prescription from her OB/GYN Dr. Tressia Danas end of June and when she saw me we added a 10 mg tablet so patient could start with 30 and  possibly increased to 40 mg per day.    Continue healthy lifestyle changes to help manage her stress.  Senior in college and is looking forward to next chapter of her life.      HLD: elevated LDL Patient declines this ever been a problem in the past. Significant counseling performed. Handouts provided. Next  Her 10 year prescribed a cardiovascular risk is 2.2%. She will engage in intensive diet and exercise regimen and we will recheck this in 6 months.   Discussed with patient that her serum creatinine is 1.03 advised she increase water intake and continue to exercise. This should be rechecked in 6 months.   Meds ordered this encounter  Medications  . FLUoxetine (PROZAC) 20 MG capsule    Sig: Take 2 capsules by mouth daily.    Refill:  1  . Vitamin D, Ergocalciferol, (DRISDOL) 50000 units CAPS capsule    Sig: Take 1 capsule (50,000 Units total) by mouth every 7 (seven) days. Take for 8 total doses(weeks)    Dispense:  12 capsule    Refill:  10  . Cholecalciferol (VITAMIN D3) 5000 units TABS    Sig: 5,000 IU OTC vitamin D3 daily.    Dispense:  90 tablet    Refill:  3  . FLUoxetine (PROZAC) 10 MG capsule    Sig:     Refill:  0  I did not order 10 mg of Prozac at this OV!!!  Please see AVS handed out to patient at the end of our visit for further patient instructions/ counseling done pertaining to today's office  visit.  Gross side effects, risk and benefits, and alternatives of medications discussed with patient.  Patient is aware that all medications have potential side effects and we are unable to predict every sideeffect or drug-drug interaction that may occur.  Expresses verbal understanding and consents to current therapy plan and treatment regiment.  Note: This document was prepared using Dragon voice recognition software and may include unintentional dictation errors.

## 2015-10-22 NOTE — Assessment & Plan Note (Deleted)
Advise 50 K weekly (prescription sent) and 5K daily over-the-counter. Recheck 4-6 months.

## 2015-10-22 NOTE — Assessment & Plan Note (Signed)
Well controlled. Continue current medications  

## 2015-10-22 NOTE — Assessment & Plan Note (Signed)
Drink half your weight in ounces water, improved gut motility through increasing activity. MiraLAX daily and increase fiber in diet.

## 2015-10-22 NOTE — Assessment & Plan Note (Signed)
This is new onset for patient. She's never had elevated A1c in the past. Last time this was checked was back in 2012 at her work.  Discussion of low-carb diet, increase activity level. Advised call insurance to see which glucometer they recommended and to check fasting blood sugars.  Advise recheck in 3-4 months.

## 2015-10-22 NOTE — Assessment & Plan Note (Deleted)
Well controlled. Continue current medications  

## 2015-10-22 NOTE — Assessment & Plan Note (Signed)
Healthy eating, moving more, tract diet when necessary. Follow-up sooner than planned if needs guidance.

## 2015-10-22 NOTE — Patient Instructions (Addendum)
D3 5,000 IU per day in addition to your wkly dose  Check your fasting blood sugar in the morning after you eat especially if you eat poorly the night before, or if you eat really healthy/ well.    This will give you an indication of how your body handles a larger sugar\carbohydrate load and feedback on what happens when you eat healthy.   Fasting blood sugar is 65-100 normally and definitely less then 126 to be in prediabetic range.       Risk factors for prediabetes and type 2 diabetes Researchers don't fully understand why some people develop prediabetes and type 2 diabetes and others don't.  It's clear that certain factors increase the risk, however, including:  Weight. The more fatty tissue you have, the more resistant your cells become to insulin.  Inactivity. The less active you are, the greater your risk. Physical activity helps you control your weight, uses up glucose as energy and makes your cells more sensitive to insulin.  Family history. Your risk increases if a parent or sibling has type 2 diabetes.  Race. Although it's unclear why, people of certain races - including blacks, Hispanics, American Indians and Asian-Americans - are at higher risk.  Age. Your risk increases as you get older. This may be because you tend to exercise less, lose muscle mass and gain weight as you age. But type 2 diabetes is also increasing dramatically among children, adolescents and younger adults.  Gestational diabetes. If you developed gestational diabetes when you were pregnant, your risk of developing prediabetes and type 2 diabetes later increases. If you gave birth to a baby weighing more than 9 pounds (4 kilograms), you're also at risk of type 2 diabetes.  Polycystic ovary syndrome. For women, having polycystic ovary syndrome - a common condition characterized by irregular menstrual periods, excess hair growth and obesity - increases the risk of diabetes.  High blood pressure. Having blood pressure  over 140/90 millimeters of mercury (mm Hg) is linked to an increased risk of type 2 diabetes.  Abnormal cholesterol and triglyceride levels. If you have low levels of high-density lipoprotein (HDL), or "good," cholesterol, your risk of type 2 diabetes is higher. Triglycerides are another type of fat carried in the blood. People with high levels of triglycerides have an increased risk of type 2 diabetes. Your doctor can let you know what your cholesterol and triglyceride levels are.    A good guide to good carbs: The glycemic index ---If you have diabetes, or at risk for diabetes, you know all too well that when you eat carbohydrates, your blood sugar goes up. The total amount of carbs you consume at a meal or in a snack mostly determines what your blood sugar will do. But the food itself also plays a role. A serving of white rice has almost the same effect as eating pure table sugar - a quick, high spike in blood sugar. A serving of lentils has a slower, smaller effect.  ---Picking good sources of carbs can help you control your blood sugar and your weight. Even if you don't have diabetes, eating healthier carbohydrate-rich foods can help ward off a host of chronic conditions, from heart disease to various cancers to, well, diabetes.  ---One way to choose foods is with the glycemic index (GI). This tool measures how much a food boosts blood sugar.  The glycemic index rates the effect of a specific amount of a food on blood sugar compared with the same amount of  pure glucose. A food with a glycemic index of 28 boosts blood sugar only 28% as much as pure glucose. One with a GI of 95 acts like pure glucose.  High glycemic foods result in a quick spike in insulin and blood sugar (also known as blood glucose). Low glycemic foods have a slower, smaller effect.   Using the glycemic index Using the glycemic index is easy: choose foods in the low GI category instead of those in the high GI category (see  below), and go easy on those in between. Low glycemic index (GI of 55 or less): Most fruits and vegetables, beans, minimally processed grains, pasta, low-fat dairy foods, and nuts.  Moderate glycemic index (GI 56 to 69): White and sweet potatoes, corn, white rice, couscous, breakfast cereals such as Cream of Wheat and Mini Wheats.  High glycemic index (GI of 70 or higher): White bread, rice cakes, most crackers, bagels, cakes, doughnuts, croissants, most packaged breakfast cereals. You can see the values for 100 commons foods and get links to more at www.health.RecordDebt.hu.  Swaps for lowering glycemic index  Instead of this high-glycemic index food Eat this lower-glycemic index food  White rice Brown rice or converted rice  Instant oatmeal Steel-cut oats  Cornflakes Bran flakes  Baked potato Pasta, bulgur  White bread Whole-grain bread  Corn Peas or leafy greens

## 2015-11-10 ENCOUNTER — Other Ambulatory Visit: Payer: Self-pay | Admitting: Obstetrics and Gynecology

## 2015-11-10 DIAGNOSIS — R928 Other abnormal and inconclusive findings on diagnostic imaging of breast: Secondary | ICD-10-CM

## 2015-11-13 ENCOUNTER — Ambulatory Visit
Admission: RE | Admit: 2015-11-13 | Discharge: 2015-11-13 | Disposition: A | Discharge status: Home / Self Care | Payer: BLUE CROSS/BLUE SHIELD | Source: Ambulatory Visit | Attending: Obstetrics and Gynecology | Admitting: Obstetrics and Gynecology

## 2015-11-13 DIAGNOSIS — R928 Other abnormal and inconclusive findings on diagnostic imaging of breast: Secondary | ICD-10-CM

## 2015-12-11 ENCOUNTER — Ambulatory Visit (INDEPENDENT_AMBULATORY_CARE_PROVIDER_SITE_OTHER): Payer: BLUE CROSS/BLUE SHIELD

## 2015-12-11 VITALS — BP 123/79 | HR 80 | Temp 98.1°F | Wt 162.5 lb

## 2015-12-11 DIAGNOSIS — Z23 Encounter for immunization: Secondary | ICD-10-CM

## 2015-12-11 NOTE — Progress Notes (Signed)
Pt here for immunizations for college.  MMR and Tdap administered left arm.  Pt tolerated injections well.  Charyl Bigger, CMA

## 2016-01-20 ENCOUNTER — Telehealth: Payer: Self-pay

## 2016-01-20 NOTE — Telephone Encounter (Signed)
Pt called stating that she has been using OTC omeprazole for GERD for several years and requests an RX so that insurance will pay for it.  Advised pt that she will need OV for evaluation before determining if she only needs meds or a possible referral to GI.  Pt expressed understanding and is agreeable.  Pt transferred to front desk to schedule appt.  Tiajuana Amass. Teigen Bellin, CMA

## 2016-01-23 ENCOUNTER — Ambulatory Visit: Payer: BLUE CROSS/BLUE SHIELD | Admitting: Family Medicine

## 2016-01-30 ENCOUNTER — Ambulatory Visit (INDEPENDENT_AMBULATORY_CARE_PROVIDER_SITE_OTHER): Payer: BLUE CROSS/BLUE SHIELD | Admitting: Family Medicine

## 2016-01-30 ENCOUNTER — Encounter: Payer: Self-pay | Admitting: Family Medicine

## 2016-01-30 VITALS — BP 113/70 | HR 73 | Ht 63.25 in | Wt 163.7 lb

## 2016-01-30 DIAGNOSIS — K219 Gastro-esophageal reflux disease without esophagitis: Secondary | ICD-10-CM | POA: Diagnosis not present

## 2016-01-30 DIAGNOSIS — E559 Vitamin D deficiency, unspecified: Secondary | ICD-10-CM | POA: Diagnosis not present

## 2016-01-30 DIAGNOSIS — R7303 Prediabetes: Secondary | ICD-10-CM

## 2016-01-30 DIAGNOSIS — I10 Essential (primary) hypertension: Secondary | ICD-10-CM

## 2016-01-30 DIAGNOSIS — Z23 Encounter for immunization: Secondary | ICD-10-CM | POA: Diagnosis not present

## 2016-01-30 DIAGNOSIS — F341 Dysthymic disorder: Secondary | ICD-10-CM

## 2016-01-30 LAB — POCT GLYCOSYLATED HEMOGLOBIN (HGB A1C): HEMOGLOBIN A1C: 6.2

## 2016-01-30 MED ORDER — VITAMIN D (ERGOCALCIFEROL) 1.25 MG (50000 UNIT) PO CAPS: 50000.0000 [IU] | ORAL_CAPSULE | ORAL | 10 refills | Status: DC

## 2016-01-30 MED ORDER — FLUOXETINE HCL 40 MG PO CAPS: 40.0000 mg | ORAL_CAPSULE | Freq: Every day | ORAL | 3 refills | Status: DC

## 2016-01-30 MED ORDER — TRIAMTERENE-HCTZ 75-50 MG PO TABS: 1.0000 | ORAL_TABLET | Freq: Every day | ORAL | 3 refills | Status: DC

## 2016-01-30 MED ORDER — OMEPRAZOLE 20 MG PO CPDR: 20.0000 mg | DELAYED_RELEASE_CAPSULE | Freq: Two times a day (BID) | ORAL | 3 refills | Status: DC

## 2016-01-30 NOTE — Progress Notes (Signed)
Impression and Recommendations:    1. Gastroesophageal reflux disease, esophagitis presence not specified   2. Prediabetes   3. Dysthymia   4. Vitamin D deficiency   5. Essential hypertension   6. Need for prophylactic vaccination and inoculation against influenza    - Do not eat or drink anything within 2-3 hours of lying down. Extensive counseling done regarding foods to avoid etc. Educational handouts provided. Meds given  - A1c today- was 6.2.  Great.  Cont prudent diet/ exercise  - RF prozac  - RF vit D wkly doses  - RF Triam-HCTZ; well controlled  - flu given   New Prescriptions   OMEPRAZOLE (PRILOSEC) 20 MG CAPSULE    Take 1 capsule (20 mg total) by mouth 2 (two) times daily. Take in the morning and at dinnertime.    Modified Medications   Modified Medication Previous Medication   FLUOXETINE (PROZAC) 40 MG CAPSULE FLUoxetine (PROZAC) 20 MG capsule      Take 1 capsule (40 mg total) by mouth daily.    Take 2 capsules by mouth daily.   TRIAMTERENE-HYDROCHLOROTHIAZIDE (MAXZIDE) 75-50 MG TABLET triamterene-hydrochlorothiazide (MAXZIDE) 75-50 MG tablet      Take 1 tablet by mouth daily.    Take 1 tablet by mouth daily.   VITAMIN D, ERGOCALCIFEROL, (DRISDOL) 50000 UNITS CAPS CAPSULE Vitamin D, Ergocalciferol, (DRISDOL) 50000 units CAPS capsule      Take 1 capsule (50,000 Units total) by mouth every 7 (seven) days. Take for 8 total doses(weeks)    Take 1 capsule (50,000 Units total) by mouth every 7 (seven) days. Take for 8 total doses(weeks)    Discontinued Medications   FLUOXETINE (PROZAC) 10 MG CAPSULE       OMEPRAZOLE (PRILOSEC OTC) 20 MG TABLET    Take 20 mg by mouth daily.    The patient was counseled, risk factors were discussed, anticipatory guidance given.  Gross side effects, risk and benefits, and alternatives of medications and treatment plan in general discussed with patient.  Patient is aware that all medications have potential side effects and we are  unable to predict every side effect or drug-drug interaction that may occur.   Patient will call with any questions prior to using medication if they have concerns.  Expresses verbal understanding and consents to current therapy and treatment regimen.  No barriers to understanding were identified.  Red flag symptoms and signs discussed in detail.  Patient expressed understanding regarding what to do in case of emergency\urgent symptoms  Return if symptoms worsen or fail to improve.;  4 mo f/up needed for chronic care  Please see AVS handed out to patient at the end of our visit for further patient instructions/ counseling done pertaining to today's office visit.    Note: This document was prepared using Dragon voice recognition software and may include unintentional dictation errors.   --------------------------------------------------------------------------------------------------------------------------------------------------------------------------------------------------------------------------------------------    Subjective:    CC:  Chief Complaint  Patient presents with  . Gastroesophageal Reflux    HPI: Leah Jimenez is a 50 y.o. female who [facility] OV for issues as discussed below.   New Sx:  GERD-   Acid reflux sx daily.  Was eating TUMS like mad.  Her mother shared meds with her---> prilosec - works great.    She has had heartburn- started 20 yrs ago with pregnancy.    Sx on and off since then.  Been daily or every other day issues since.   Belching minimial;   When lies down- acid comes back up.  Never d/c doc in past.   Needs refills of her blood pressure, mood meds as well as her vitamin D supplements. Tol everything well, no S-E.     Wt Readings from Last 3 Encounters:  01/30/16 163 lb 11.2 oz (74.3 kg)  12/11/15 162 lb 8 oz (73.7 kg)  10/22/15 160 lb (72.6 kg)   BP Readings from Last 3  Encounters:  01/30/16 113/70  12/11/15 123/79  10/22/15 119/78   Pulse Readings from Last 3 Encounters:  01/30/16 73  12/11/15 80  10/22/15 71   BMI Readings from Last 3 Encounters:  01/30/16 28.77 kg/m  12/11/15 28.56 kg/m  10/22/15 28.12 kg/m     Patient Care Team    Relationship Specialty Notifications Start End  Thomasene Lot, DO PCP - General Family Medicine  10/01/15     Patient Active Problem List   Diagnosis Date Noted  . GERD (gastroesophageal reflux disease) 01/30/2016  . Prediabetes 10/22/2015  . Vitamin D deficiency 10/22/2015  . Essential hypertension 10/22/2015  . HLD: elevated LDL 10/22/2015  . Overweight (BMI 25.0-29.9) 10/01/2015  . OSA on CPAP 03/06/2009  . ALLERGIC RHINITIS 12/26/2008  . HYPERSOMNIA 12/26/2008  . Dysthymia 12/15/2008  . Constipation 12/13/2008  . RECTAL BLEEDING 12/13/2008  . SNORING 12/13/2008    Past Medical history, Surgical history, Family history, Social history, Allergies and Medications have been entered into the medical record, reviewed and changed as needed.   Allergies:  No Known Allergies  Review of Systems  Constitutional: Negative.  Negative for chills, diaphoresis, fever, malaise/fatigue and weight loss.  HENT: Negative.  Negative for congestion, sore throat and tinnitus.   Eyes: Negative.  Negative for blurred vision, double vision and photophobia.  Respiratory: Negative.  Negative for cough and wheezing.   Cardiovascular: Negative.  Negative for chest pain and palpitations.  Gastrointestinal: Positive for heartburn. Negative for blood in stool, diarrhea, nausea and vomiting.       Pt states this has been a problem x approximately 25 years.  Has been buying OTC Prilosec but requests an RX so that insurance will pay for it  Genitourinary: Negative.  Negative for dysuria, frequency and urgency.  Musculoskeletal: Negative.  Negative for joint pain and myalgias.  Skin: Negative.  Negative for itching and rash.    Neurological: Negative.  Negative for dizziness, focal weakness, weakness and headaches.  Endo/Heme/Allergies: Negative.  Negative for environmental allergies and polydipsia. Does not bruise/bleed easily.  Psychiatric/Behavioral: Negative.  Negative for depression and memory loss. The patient is not nervous/anxious and does not have insomnia.      Objective:   Blood pressure 113/70, pulse 73, height 5' 3.25" (1.607 m), weight 163 lb 11.2 oz (74.3 kg), last menstrual period 01/05/2016. Body mass index is 28.77 kg/m. General: Well Developed, well nourished, appropriate for stated age.  Neuro: Alert and oriented x3, extra-ocular muscles intact, sensation grossly intact.  HEENT: Normocephalic, atraumatic, neck supple   Skin: Warm and dry, no gross rash. Cardiac: RRR, S1 S2,  no murmurs rubs or gallops.  Respiratory: ECTA B/L, Not using accessory muscles, speaking in full sentences-unlabored. Vascular:  No gross lower ext edema, cap RF less 2 sec. Psych: No HI/SI, judgement and insight good, Euthymic mood. Full Affect.

## 2016-01-30 NOTE — Patient Instructions (Addendum)
Do not eat or drink anything within 2-3 hours of lying down  Gastroesophageal Reflux Disease, Adult Normally, food travels down the esophagus and stays in the stomach to be digested. However, when a person has gastroesophageal reflux disease (GERD), food and stomach acid move back up into the esophagus. When this happens, the esophagus becomes sore and inflamed. Over time, GERD can create small holes (ulcers) in the lining of the esophagus.  CAUSES This condition is caused by a problem with the muscle between the esophagus and the stomach (lower esophageal sphincter, or LES). Normally, the LES muscle closes after food passes through the esophagus to the stomach. When the LES is weakened or abnormal, it does not close properly, and that allows food and stomach acid to go back up into the esophagus. The LES can be weakened by certain dietary substances, medicines, and medical conditions, including:  Tobacco use.  Pregnancy.  Having a hiatal hernia.  Heavy alcohol use.  Certain foods and beverages, such as coffee, chocolate, onions, and peppermint. RISK FACTORS This condition is more likely to develop in:  People who have an increased body weight.  People who have connective tissue disorders.  People who use NSAID medicines. SYMPTOMS Symptoms of this condition include:  Heartburn.  Difficult or painful swallowing.  The feeling of having a lump in the throat.  Abitter taste in the mouth.  Bad breath.  Having a large amount of saliva.  Having an upset or bloated stomach.  Belching.  Chest pain.  Shortness of breath or wheezing.  Ongoing (chronic) cough or a night-time cough.  Wearing away of tooth enamel.  Weight loss. Different conditions can cause chest pain. Make sure to see your health care provider if you experience chest pain. DIAGNOSIS Your health care provider will take a medical history and perform a physical exam. To determine if you have mild or severe  GERD, your health care provider may also monitor how you respond to treatment. You may also have other tests, including:  An endoscopy toexamine your stomach and esophagus with a small camera.  A test thatmeasures the acidity level in your esophagus.  A test thatmeasures how much pressure is on your esophagus.  A barium swallow or modified barium swallow to show the shape, size, and functioning of your esophagus. TREATMENT The goal of treatment is to help relieve your symptoms and to prevent complications. Treatment for this condition may vary depending on how severe your symptoms are. Your health care provider may recommend:  Changes to your diet.  Medicine.  Surgery. HOME CARE INSTRUCTIONS Diet  Follow a diet as recommended by your health care provider. This may involve avoiding foods and drinks such as:  Coffee and tea (with or without caffeine).  Drinks that containalcohol.  Energy drinks and sports drinks.  Carbonated drinks or sodas.  Chocolate and cocoa.  Peppermint and mint flavorings.  Garlic and onions.  Horseradish.  Spicy and acidic foods, including peppers, chili powder, curry powder, vinegar, hot sauces, and barbecue sauce.  Citrus fruit juices and citrus fruits, such as oranges, lemons, and limes.  Tomato-based foods, such as red sauce, chili, salsa, and pizza with red sauce.  Fried and fatty foods, such as donuts, french fries, potato chips, and high-fat dressings.  High-fat meats, such as hot dogs and fatty cuts of red and white meats, such as rib eye steak, sausage, ham, and bacon.  High-fat dairy items, such as whole milk, butter, and cream cheese.  Eat small, frequent meals  instead of large meals.  Avoid drinking large amounts of liquid with your meals.  Avoid eating meals during the 2-3 hours before bedtime.  Avoid lying down right after you eat.  Do not exercise right after you eat. General Instructions  Pay attention to any  changes in your symptoms.  Take over-the-counter and prescription medicines only as told by your health care provider. Do not take aspirin, ibuprofen, or other NSAIDs unless your health care provider told you to do so.  Do not use any tobacco products, including cigarettes, chewing tobacco, and e-cigarettes. If you need help quitting, ask your health care provider.  Wear loose-fitting clothing. Do not wear anything tight around your waist that causes pressure on your abdomen.  Raise (elevate) the head of your bed 6 inches (15cm).  Try to reduce your stress, such as with yoga or meditation. If you need help reducing stress, ask your health care provider.  If you are overweight, reduce your weight to an amount that is healthy for you. Ask your health care provider for guidance about a safe weight loss goal.  Keep all follow-up visits as told by your health care provider. This is important. SEEK MEDICAL CARE IF:  You have new symptoms.  You have unexplained weight loss.  You have difficulty swallowing, or it hurts to swallow.  You have wheezing or a persistent cough.  Your symptoms do not improve with treatment.  You have a hoarse voice. SEEK IMMEDIATE MEDICAL CARE IF:  You have pain in your arms, neck, jaw, teeth, or back.  You feel sweaty, dizzy, or light-headed.  You have chest pain or shortness of breath.  You vomit and your vomit looks like blood or coffee grounds.  You faint.  Your stool is bloody or black.  You cannot swallow, drink, or eat.   This information is not intended to replace advice given to you by your health care provider. Make sure you discuss any questions you have with your health care provider.   Document Released: 12/30/2004 Document Revised: 12/11/2014 Document Reviewed: 07/17/2014 Elsevier Interactive Patient Education 2016 [employer].    Food Choices for Gastroesophageal Reflux Disease, Adult When you have gastroesophageal reflux disease  (GERD), the foods you eat and your eating habits are very important. Choosing the right foods can help ease the discomfort of GERD. WHAT GENERAL GUIDELINES DO I NEED TO FOLLOW?  Choose fruits, vegetables, whole grains, low-fat dairy products, and low-fat meat, fish, and poultry.  Limit fats such as oils, salad dressings, butter, nuts, and avocado.  Keep a food diary to identify foods that cause symptoms.  Avoid foods that cause reflux. These may be different for different people.  Eat frequent small meals instead of three large meals each day.  Eat your meals slowly, in a relaxed setting.  Limit fried foods.  Cook foods using methods other than frying.  Avoid drinking alcohol.  Avoid drinking large amounts of liquids with your meals.  Avoid bending over or lying down until 2-3 hours after eating. WHAT FOODS ARE NOT RECOMMENDED? The following are some foods and drinks that may worsen your symptoms: Vegetables Tomatoes. Tomato juice. Tomato and spaghetti sauce. Chili peppers. Onion and garlic. Horseradish. Fruits Oranges, grapefruit, and lemon (fruit and juice). Meats High-fat meats, fish, and poultry. This includes hot dogs, ribs, ham, sausage, salami, and bacon. Dairy Whole milk and chocolate milk. Sour cream. Cream. Butter. Ice cream. Cream cheese.  Beverages Coffee and tea, with or without caffeine. Carbonated beverages or  energy drinks. Condiments Hot sauce. Barbecue sauce.  Sweets/Desserts Chocolate and cocoa. Donuts. Peppermint and spearmint. Fats and Oils High-fat foods, including [place] fries and potato chips. Other Vinegar. Strong spices, such as black pepper, white pepper, red pepper, cayenne, curry powder, cloves, ginger, and chili powder. The items listed above may not be a complete list of foods and beverages to avoid. Contact your dietitian for more information.   This information is not intended to replace advice given to you by your health care provider.  Make sure you discuss any questions you have with your health care provider.   Document Released: 03/22/2005 Document Revised: 04/12/2014 Document Reviewed: 01/24/2013 Elsevier Interactive Patient Education [employer]

## 2016-02-20 ENCOUNTER — Other Ambulatory Visit: Payer: BLUE CROSS/BLUE SHIELD

## 2016-03-12 ENCOUNTER — Ambulatory Visit (INDEPENDENT_AMBULATORY_CARE_PROVIDER_SITE_OTHER): Payer: BLUE CROSS/BLUE SHIELD | Admitting: Family Medicine

## 2016-03-12 ENCOUNTER — Encounter: Payer: Self-pay | Admitting: Family Medicine

## 2016-03-12 VITALS — BP 106/68 | HR 69 | Ht 63.25 in | Wt 164.3 lb

## 2016-03-12 DIAGNOSIS — E663 Overweight: Secondary | ICD-10-CM | POA: Diagnosis not present

## 2016-03-12 DIAGNOSIS — E559 Vitamin D deficiency, unspecified: Secondary | ICD-10-CM | POA: Diagnosis not present

## 2016-03-12 DIAGNOSIS — F341 Dysthymic disorder: Secondary | ICD-10-CM

## 2016-03-12 DIAGNOSIS — I10 Essential (primary) hypertension: Secondary | ICD-10-CM

## 2016-03-12 DIAGNOSIS — K219 Gastro-esophageal reflux disease without esophagitis: Secondary | ICD-10-CM | POA: Diagnosis not present

## 2016-03-12 MED ORDER — FLUOXETINE HCL 20 MG PO CAPS: 20.0000 mg | ORAL_CAPSULE | Freq: Every day | ORAL | 3 refills | Status: DC

## 2016-03-12 NOTE — Assessment & Plan Note (Signed)
Cont  supp- will reck 4 mo along with bmp and A1c

## 2016-03-12 NOTE — Progress Notes (Signed)
Impression and Recommendations:    1. Essential hypertension   2. Overweight (BMI 25.0-29.9)   3. Vitamin D deficiency   4. Gastroesophageal reflux disease, esophagitis presence not specified   5. Dysthymia     Vitamin D deficiency Cont  supp- will reck 4 mo along with bmp and A1c  Prediabetes 6.2 and of October. We'll recheck in 4 months at the end of February or so.  Dysthymia Pt takes anywhere from 20-40mg  depending on her stress levels that week.   She knows NOT to go off or up/ down by more than one tab w/o consulting me first.   - stress mgt tech d/c pt  Essential hypertension Well-controlled. Continue current medications.  RF's given prn    New Prescriptions   FLUOXETINE (PROZAC) 20 MG CAPSULE    Take 1 capsule (20 mg total) by mouth daily.    Modified Medications   No medications on file    Discontinued Medications   No medications on file    The patient was counseled, risk factors were discussed, anticipatory guidance given.  Gross side effects, risk and benefits, and alternatives of medications and treatment plan in general discussed with patient.  Patient is aware that all medications have potential side effects and we are unable to predict every side effect or drug-drug interaction that may occur.   Patient will call with any questions prior to using medication if they have concerns.  Expresses verbal understanding and consents to current therapy and treatment regimen.  No barriers to understanding were identified.  Red flag symptoms and signs discussed in detail.  Patient expressed understanding regarding what to do in case of emergency\urgent symptoms   Return for --->End February for A1c, FLP, vit D, BMP, then follow-up chronic isssues, HTN, Mood, Pre-DM, wt etc.;    Please see AVS handed out to patient at the end of our visit for further patient instructions/ counseling done pertaining to today's office visit.    Note: This document was  prepared using Dragon voice recognition software and may include unintentional dictation errors.   --------------------------------------------------------------------------------------------------------------------------------------------------------------------------------------------------------------------------------------------    Subjective:    CC:  Chief Complaint  Patient presents with  . Hypertension  . Gastroesophageal Reflux    HPI: Leah RalphsSharon H Stormer is a 50 y.o. female [facility] for issues as discussed below  ---> Last office visit we started her on omeprazole.  We also increased her Prozac.  Added vitamin D.   GERD-   No prob with new med of omeprazole.  No sx now.    Vit D-  Feels it gives her more energy.    HTN-  Not checking at home.  No SX- see ROS.  No complaints, tol med well   Mood- inc last time from 20---> 40.    tol well. Working well.  Less anxious / less emotional.  likes results.  No counseling yet- no time   Needs refills of her blood pressure, mood meds as well as her vitamin D supplements. Tol everything well, no S-E.     Wt Readings from Last 3 Encounters:  03/12/16 164 lb 4.8 oz (74.5 kg)  01/30/16 163 lb 11.2 oz (74.3 kg)  12/11/15 162 lb 8 oz (73.7 kg)   BP Readings from Last 3 Encounters:  03/12/16 106/68  01/30/16 113/70  12/11/15 123/79   Pulse Readings from Last 3 Encounters:  03/12/16 69  01/30/16  73  12/11/15 80   BMI Readings from Last 3 Encounters:  03/12/16 28.87 kg/m  01/30/16 28.77 kg/m  12/11/15 28.56 kg/m     Patient Care Team    Relationship Specialty Notifications Start End  Thomasene Loteborah Calvert Charland, DO PCP - General Family Medicine  10/01/15     Patient Active Problem List   Diagnosis Date Noted  . Prediabetes 10/22/2015    Priority: High  . Essential hypertension 10/22/2015    Priority: High  . HLD: elevated LDL 10/22/2015    Priority: High    . Overweight (BMI 25.0-29.9) 10/01/2015    Priority: High  . Dysthymia 12/15/2008    Priority: High  . GERD (gastroesophageal reflux disease) 01/30/2016    Priority: Medium  . OSA on CPAP 03/06/2009    Priority: Medium  . HYPERSOMNIA 12/26/2008    Priority: Medium  . Vitamin D deficiency 10/22/2015    Priority: Low  . ALLERGIC RHINITIS 12/26/2008  . Constipation 12/13/2008  . RECTAL BLEEDING 12/13/2008    Past Medical history, Surgical history, Family history, Social history, Allergies and Medications have been entered into the medical record, reviewed and changed as needed.   Allergies:  No Known Allergies  Review of Systems  Constitutional: Negative.  Negative for chills, diaphoresis, fever, malaise/fatigue and weight loss.  HENT: Negative.  Negative for congestion, sore throat and tinnitus.   Eyes: Negative.  Negative for blurred vision, double vision and photophobia.  Respiratory: Negative.  Negative for cough and wheezing.   Cardiovascular: Negative.  Negative for chest pain and palpitations.  Gastrointestinal: Positive for heartburn. Negative for blood in stool, diarrhea, nausea and vomiting.       Pt states this has been a problem x approximately 25 years.  Has been buying OTC Prilosec but requests an RX so that insurance will pay for it  Genitourinary: Negative.  Negative for dysuria, frequency and urgency.  Musculoskeletal: Negative.  Negative for joint pain and myalgias.  Skin: Negative.  Negative for itching and rash.  Neurological: Negative.  Negative for dizziness, focal weakness, weakness and headaches.  Endo/Heme/Allergies: Negative.  Negative for environmental allergies and polydipsia. Does not bruise/bleed easily.  Psychiatric/Behavioral: Negative.  Negative for depression and memory loss. The patient is not nervous/anxious and does not have insomnia.      Objective:   Blood pressure 106/68, pulse 69, height 5' 3.25" (1.607 m), weight 164 lb 4.8 oz (74.5  kg). Body mass index is 28.87 kg/m. General: Well Developed, well nourished, appropriate for stated age.  Neuro: Alert and oriented x3, extra-ocular muscles intact, sensation grossly intact.  HEENT: Normocephalic, atraumatic, neck supple   Skin: Warm and dry, no gross rash. Cardiac: RRR, S1 S2,  no murmurs rubs or gallops.  Respiratory: ECTA B/L, Not using accessory muscles, speaking in full sentences-unlabored. Vascular:  No gross lower ext edema, cap RF less 2 sec. Psych: No HI/SI, judgement and insight good, Euthymic mood. Full Affect.

## 2016-03-12 NOTE — Patient Instructions (Addendum)
Try the Prozac 40 mg daily but the pre-period, just take 1 extra (60 mg daily) just for those couple days where she usually have a difficult time emotionally before her periods. Then go right back to the 40 daily.   DASH Eating Plan DASH stands for "Dietary Approaches to Stop Hypertension." The DASH eating plan is a healthy eating plan that has been shown to reduce high blood pressure (hypertension). Additional health benefits may include reducing the risk of type 2 diabetes mellitus, heart disease, and stroke. The DASH eating plan may also help with weight loss. What do I need to know about the DASH eating plan? For the DASH eating plan, you will follow these general guidelines:  Choose foods with less than 150 milligrams of sodium per serving (as listed on the food label).  Use salt-free seasonings or herbs instead of table salt or sea salt.  Check with your health care provider or pharmacist before using salt substitutes.  Eat lower-sodium products. These are often labeled as "low-sodium" or "no salt added."  Eat fresh foods. Avoid eating a lot of canned foods.  Eat more vegetables, fruits, and low-fat dairy products.  Choose whole grains. Look for the word "whole" as the first word in the ingredient list.  Choose fish and skinless chicken or [place] more often than red meat. Limit fish, poultry, and meat to 6 oz (170 g) each day.  Limit sweets, desserts, sugars, and sugary drinks.  Choose heart-healthy fats.  Eat more home-cooked food and less restaurant, buffet, and fast food.  Limit fried foods.  Do not fry foods. Cook foods using methods such as baking, boiling, grilling, and broiling instead.  When eating at a restaurant, ask that your food be prepared with less salt, or no salt if possible. What foods can I eat? Seek help from a dietitian for individual calorie needs. Grains  Whole grain or whole wheat bread. Brown rice. Whole grain or whole wheat pasta. Quinoa, bulgur,  and whole grain cereals. Low-sodium cereals. Corn or whole wheat flour tortillas. Whole grain cornbread. Whole grain crackers. Low-sodium crackers. Vegetables  Fresh or frozen vegetables (raw, steamed, roasted, or grilled). Low-sodium or reduced-sodium tomato and vegetable juices. Low-sodium or reduced-sodium tomato sauce and paste. Low-sodium or reduced-sodium canned vegetables. Fruits  All fresh, canned (in natural juice), or frozen fruits. Meat and Other Protein Products  Ground beef (85% or leaner), grass-fed beef, or beef trimmed of fat. Skinless chicken or [place]. Ground chicken or [place]key. Pork trimmed of fat. All fish and seafood. Eggs. Dried beans, peas, or lentils. Unsalted nuts and seeds. Unsalted canned beans. Dairy  Low-fat dairy products, such as skim or 1% milk, 2% or reduced-fat cheeses, low-fat ricotta or cottage cheese, or plain low-fat yogurt. Low-sodium or reduced-sodium cheeses. Fats and Oils  Tub margarines without trans fats. Light or reduced-fat mayonnaise and salad dressings (reduced sodium). Avocado. Safflower, olive, or canola oils. Natural peanut or almond butter. Other  Unsalted popcorn and pretzels. The items listed above may not be a complete list of recommended foods or beverages. Contact your dietitian for more options.  What foods are not recommended? Grains  White bread. White pasta. White rice. Refined cornbread. Bagels and croissants. Crackers that contain trans fat. Vegetables  Creamed or fried vegetables. Vegetables in a cheese sauce. Regular canned vegetables. Regular canned tomato sauce and paste. Regular tomato and vegetable juices. Fruits  Canned fruit in light or heavy syrup. Fruit juice. Meat and Other Protein Products  Fatty cuts of  meat. Ribs, chicken wings, bacon, sausage, bologna, salami, chitterlings, fatback, hot dogs, bratwurst, and packaged luncheon meats. Salted nuts and seeds. Canned beans with salt. Dairy  Whole or 2% milk, cream,  half-and-half, and cream cheese. Whole-fat or sweetened yogurt. Full-fat cheeses or blue cheese. Nondairy creamers and whipped toppings. Processed cheese, cheese spreads, or cheese curds. Condiments  Onion and garlic salt, seasoned salt, table salt, and sea salt. Canned and packaged gravies. Worcestershire sauce. Tartar sauce. Barbecue sauce. Teriyaki sauce. Soy sauce, including reduced sodium. Steak sauce. Fish sauce. Oyster sauce. Cocktail sauce. Horseradish. Ketchup and mustard. Meat flavorings and tenderizers. Bouillon cubes. Hot sauce. Tabasco sauce. Marinades. Taco seasonings. Relishes. Fats and Oils  Butter, stick margarine, lard, shortening, ghee, and bacon fat. Coconut, palm kernel, or palm oils. Regular salad dressings. Other  Pickles and olives. Salted popcorn and pretzels. The items listed above may not be a complete list of foods and beverages to avoid. Contact your dietitian for more information.  Where can I find more information? National Heart, Lung, and Blood Institute: CablePromo.it This information is not intended to replace advice given to you by your health care provider. Make sure you discuss any questions you have with your health care provider. Document Released: 03/11/2011 Document Revised: 08/28/2015 Document Reviewed: 01/24/2013 Elsevier Interactive Patient Education  2017 Elsevier Inc.    Preventing Type 2 Diabetes Mellitus Type 2 diabetes (type 2 diabetes mellitus) is a long-term (chronic) disease that affects blood sugar (glucose) levels. Normally, a hormone called insulin allows glucose to enter cells in the body. The cells use glucose for energy. In type 2 diabetes, one or both of these problems may be present:  The body does not make enough insulin.  The body does not respond properly to insulin that it makes (insulin resistance). Insulin resistance or lack of insulin causes excess glucose to build up in the blood instead  of going into cells. As a result, high blood glucose (hyperglycemia) develops, which can cause many complications. Being overweight or obese and having an inactive (sedentary) lifestyle can increase your risk for diabetes. Type 2 diabetes can be delayed or prevented by making certain nutrition and lifestyle changes. What nutrition changes can be made?  Eat healthy meals and snacks regularly. Keep a healthy snack with you for when you get hungry between meals, such as fruit or a handful of nuts.  Eat lean meats and proteins that are low in saturated fats, such as chicken, fish, egg whites, and beans. Avoid processed meats.  Eat plenty of fruits and vegetables and plenty of grains that have not been processed (whole grains). It is recommended that you eat:  1?2 cups of fruit every day.  2?3 cups of vegetables every day.  6?8 oz of whole grains every day, such as oats, whole wheat, bulgur, brown rice, quinoa, and millet.  Eat low-fat dairy products, such as milk, yogurt, and cheese.  Eat foods that contain healthy fats, such as nuts, avocado, olive oil, and canola oil.  Drink water throughout the day. Avoid drinks that contain added sugar, such as soda or sweet tea.  Follow instructions from your health care provider about specific eating or drinking restrictions.  Control how much food you eat at a time (portion size).  Check food labels to find out the serving sizes of foods.  Use a kitchen scale to weigh amounts of foods.  Saute or steam food instead of frying it. Cook with water or broth instead of oils or butter.  Limit  your intake of:  Salt (sodium). Have no more than 1 tsp (2,400 mg) of sodium a day. If you have heart disease or high blood pressure, have less than ? tsp (1,500 mg) of sodium a day.  Saturated fat. This is fat that is solid at room temperature, such as butter or fat on meat. What lifestyle changes can be made?  Activity  Do moderate-intensity physical  activity for at least 30 minutes on at least 5 days of the week, or as much as told by your health care provider.  Ask your health care provider what activities are safe for you. A mix of physical activities may be best, such as walking, swimming, cycling, and strength training.  Try to add physical activity into your day. For example:  Park in spots that are farther away than usual, so that you walk more. For example, park in a far corner of the parking lot when you go to the office or the grocery store.  Take a walk during your lunch break.  Use stairs instead of elevators or escalators. Weight Loss  Lose weight as directed. Your health care provider can determine how much weight loss is best for you and can help you lose weight safely.  If you are overweight or obese, you may be instructed to lose at least 5?7 % of your body weight. Alcohol and Tobacco   Limit alcohol intake to no more than 1 drink a day for nonpregnant women and 2 drinks a day for men. One drink equals 12 oz of beer, 5 oz of wine, or 1 oz of hard liquor.  Do not use any tobacco products, such as cigarettes, chewing tobacco, and e-cigarettes. If you need help quitting, ask your health care provider. Work With Your Health Care Provider  Have your blood glucose tested regularly, as told by your health care provider.  Discuss your risk factors and how you can reduce your risk for diabetes.  Get screening tests as told by your health care provider. You may have screening tests regularly, especially if you have certain risk factors for type 2 diabetes.  Make an appointment with a diet and nutrition specialist (registered dietitian). A registered dietitian can help you make a healthy eating plan and can help you understand portion sizes and food labels. Why are these changes important?  It is possible to prevent or delay type 2 diabetes and related health problems by making lifestyle and nutrition changes.  It can be  difficult to recognize signs of type 2 diabetes. The best way to avoid possible damage to your body is to take actions to prevent the disease before you develop symptoms. What can happen if changes are not made?  Your blood glucose levels may keep increasing. Having high blood glucose for a long time is dangerous. Too much glucose in your blood can damage your blood vessels, heart, kidneys, nerves, and eyes.  You may develop prediabetes or type 2 diabetes. Type 2 diabetes can lead to many chronic health problems and complications, such as:  Heart disease.  Stroke.  Blindness.  Kidney disease.  Depression.  Poor circulation in the feet and legs, which could lead to surgical removal (amputation) in severe cases. Where to find support:  Ask your health care provider to recommend a registered dietitian, diabetes educator, or weight loss program.  Look for local or online weight loss groups.  Join a gym, fitness club, or outdoor activity group, such as a walking club. Where to find  more information: To learn more about diabetes and diabetes prevention, visit:  American Diabetes Association (ADA): www.diabetes.[employer]  National Institute of Diabetes and Digestive and Kidney Diseases: ToyArticles.cawww.niddk.nih.gov/health-information/diabetes To learn more about healthy eating, visit:  The U.S. Department of Agriculture Architect(USDA), Choose My Plate: http://yates.biz/www.choosemyplate.gov/food-groups  Office of Disease Prevention and Health Promotion (ODPHP), Dietary Guidelines: ListingMagazine.siwww.health.gov/dietaryguidelines Summary  You can reduce your risk for type 2 diabetes by increasing your physical activity, eating healthy foods, and losing weight as directed.  Talk with your health care provider about your risk for type 2 diabetes. Ask about any blood tests or screening tests that you need to have. This information is not intended to replace advice given to you by your health care provider. Make sure you discuss any questions  you have with your health care provider. Document Released: 07/14/2015 Document Revised: 08/28/2015 Document Reviewed: 05/13/2015 Elsevier Interactive Patient Education  2017 [employer]

## 2016-03-12 NOTE — Assessment & Plan Note (Signed)
6.2 and of October. We'll recheck in 4 months at the end of February or so.

## 2016-04-11 NOTE — Assessment & Plan Note (Addendum)
Pt takes anywhere from 20-40mg  depending on her stress levels that week.   She knows NOT to go off or up/ down by more than one tab w/o consulting me first.   - stress mgt tech d/c pt

## 2016-04-11 NOTE — Assessment & Plan Note (Signed)
Well-controlled. Continue current medications.  RF's given prn

## 2016-06-18 ENCOUNTER — Ambulatory Visit: Payer: BLUE CROSS/BLUE SHIELD | Admitting: Family Medicine

## 2016-07-26 ENCOUNTER — Other Ambulatory Visit: Payer: Self-pay | Admitting: Family Medicine

## 2016-08-13 ENCOUNTER — Encounter: Payer: Self-pay | Admitting: Family Medicine

## 2016-08-13 ENCOUNTER — Ambulatory Visit (INDEPENDENT_AMBULATORY_CARE_PROVIDER_SITE_OTHER): Payer: BLUE CROSS/BLUE SHIELD | Admitting: Family Medicine

## 2016-08-13 VITALS — BP 112/68 | HR 78 | Ht 63.25 in | Wt 164.3 lb

## 2016-08-13 DIAGNOSIS — G4710 Hypersomnia, unspecified: Secondary | ICD-10-CM

## 2016-08-13 DIAGNOSIS — E663 Overweight: Secondary | ICD-10-CM | POA: Diagnosis not present

## 2016-08-13 DIAGNOSIS — I158 Other secondary hypertension: Secondary | ICD-10-CM | POA: Diagnosis not present

## 2016-08-13 DIAGNOSIS — F341 Dysthymic disorder: Secondary | ICD-10-CM | POA: Diagnosis not present

## 2016-08-13 DIAGNOSIS — E559 Vitamin D deficiency, unspecified: Secondary | ICD-10-CM

## 2016-08-13 DIAGNOSIS — R0683 Snoring: Secondary | ICD-10-CM

## 2016-08-13 DIAGNOSIS — R7303 Prediabetes: Secondary | ICD-10-CM | POA: Diagnosis not present

## 2016-08-13 DIAGNOSIS — R400 Somnolence: Secondary | ICD-10-CM | POA: Diagnosis not present

## 2016-08-13 DIAGNOSIS — Z9989 Dependence on other enabling machines and devices: Secondary | ICD-10-CM

## 2016-08-13 DIAGNOSIS — E782 Mixed hyperlipidemia: Secondary | ICD-10-CM

## 2016-08-13 DIAGNOSIS — I10 Essential (primary) hypertension: Secondary | ICD-10-CM

## 2016-08-13 DIAGNOSIS — G4733 Obstructive sleep apnea (adult) (pediatric): Secondary | ICD-10-CM

## 2016-08-13 LAB — POCT GLYCOSYLATED HEMOGLOBIN (HGB A1C): HEMOGLOBIN A1C: 6.2

## 2016-08-13 MED ORDER — VITAMIN D (ERGOCALCIFEROL) 1.25 MG (50000 UNIT) PO CAPS: 50000.0000 [IU] | ORAL_CAPSULE | ORAL | 10 refills | Status: DC

## 2016-08-13 NOTE — Progress Notes (Signed)
Impression and Recommendations:    1. Vitamin D deficiency   2. Pre-diabetes   3. Other secondary hypertension   4. OSA on CPAP   5. Snoring   6. Uncontrolled daytime somnolence   7. HYPERSOMNIA   8. Prediabetes   9. Overweight (BMI 25.0-29.9)   10. Essential hypertension   11. Dysthymia   12. Mixed hyperlipidemia     OSA on CPAP In 2011 - w Pulm Med- Dr Shelle Ironlance had tested pt and showed needed intervention.   At that time, pt said she was told she did not need any intervention and testing was ok.  - We will send for repeat sleep study/ testing now to cardiology/ sleep medicine    Prediabetes A1c today was 6.2 again today  -Counseling done regarding disease process and various treatment options.  All questions answered  Handouts given - extensive info on diet/ lifestyle mod      Dysthymia Cont 20-40mg  QD- depending on stress levels  Reminded pt that exercise, diet and lifestyle mod are important in treating mood d/o as well.  rec counseling    Overweight (BMI 25.0-29.9) BMI counseling done    Essential hypertension Well-controlled. Continue current medications.    Check BMP;   RF's given prn      HLD: elevated LDL Check labs - FLP near future/ next OV- about one yr from when last checked      Vitamin D deficiency Will check labs--- cont supp unless we tell her otherwise     Education and routine counseling performed. Handouts provided.   Meds ordered this encounter  Medications  . Vitamin D, Ergocalciferol, (DRISDOL) 50000 units CAPS capsule    Sig: Take 1 capsule (50,000 Units total) by mouth every 7 (seven) days. Take for 8 total doses(weeks)    Dispense:  12 capsule    Refill:  10     Orders Placed This Encounter  Procedures  . VITAMIN D 25 Hydroxy (Vit-D Deficiency, Fractures)  . Basic metabolic panel  . Ambulatory referral to Sleep Studies  . POCT glycosylated hemoglobin (Hb A1C)     Return for Hypertension  follow up every 4 mo.  The patient was counseled, risk factors were discussed, anticipatory guidance given.  Gross side effects, risk and benefits, and alternatives of medications discussed with patient.  Patient is aware that all medications have potential side effects and we are unable to predict every side effect or drug-drug interaction that may occur.  Expresses verbal understanding and consents to current therapy plan and treatment regimen.  Please see AVS handed out to patient at the end of our visit for further patient instructions/ counseling done pertaining to today's office visit.    Note: This document was prepared using Dragon voice recognition software and may include unintentional dictation errors.     Subjective:    Chief Complaint  Patient presents with  . Hypertension  . Hyperglycemia  . Dysthymia    HPI: Leah Jimenez is a 51 y.o. female who [facility] up for HTN.     Snoring/ unable to fall asleep sometimes:    Un-rested feeling.  Snores so bad that she won't sleep over someone's house. Embarrassing for her.     Mood:   prozac inc from 20 to 40mg  in Oct.   Doing well.   Does think she is much better off now than prior to meds   HTN: -  Her blood pressure has been controlled at home.  - Patient reports good compliance with blood pressure medications - Denies medication S-E - Smoking Status noted  - She denies new onset of: chest pain, exercise intolerance, shortness of breath, dizziness, visual changes, headache, lower extremity swelling or claudication.   Today their BP is BP: 112/68  Last 3 blood pressure readings in our office are as follows: BP Readings from Last 3 Encounters:  08/13/16 112/68  03/12/16 106/68  01/30/16 113/70    Pulse Readings from Last 3 Encounters:  08/13/16 78  03/12/16 69  01/30/16 73    Filed Weights   08/13/16 1004  Weight: 164 lb 4.8 oz (74.5 kg)       Patient Care Team    Relationship Specialty Notifications Start End  Thomasene Lot, DO PCP - General Family Medicine  10/01/15      Lab Results  Component Value Date   CREATININE 0.90 08/13/2016   BUN 14 08/13/2016   NA 142 08/13/2016   K 4.5 08/13/2016   CL 100 08/13/2016   CO2 29 08/13/2016    Lab Results  Component Value Date   CHOL 245 (H) 10/15/2015   CHOL 173 12/13/2008    Lab Results  Component Value Date   HDL 76 10/15/2015   HDL 68 12/13/2008    Lab Results  Component Value Date   LDLCALC 149 (H) 10/15/2015   LDLCALC 95 12/13/2008    Lab Results  Component Value Date   TRIG 102 10/15/2015   TRIG 52 12/13/2008    Lab Results  Component Value Date   CHOLHDL 3.2 10/15/2015   CHOLHDL 2.5 Ratio 12/13/2008    No results found for: LDLDIRECT ===================================================================   Patient Active Problem List   Diagnosis Date Noted  . Prediabetes 10/22/2015    Priority: High  . Essential hypertension 10/22/2015    Priority: High  . HLD: elevated LDL 10/22/2015    Priority: High  . Overweight (BMI 25.0-29.9) 10/01/2015    Priority: High  . Dysthymia 12/15/2008    Priority: High  . GERD (gastroesophageal reflux disease) 01/30/2016    Priority: Medium  . OSA on CPAP 03/06/2009    Priority: Medium  . HYPERSOMNIA 12/26/2008    Priority: Medium  . Vitamin D deficiency 10/22/2015    Priority: Low  . ALLERGIC RHINITIS 12/26/2008  . Constipation 12/13/2008  . RECTAL BLEEDING 12/13/2008     Past Medical History:  Diagnosis Date  . Constipation   . Depression      Past Surgical History:  Procedure Laterality Date  . ENDOMETRIAL ABLATION    . EYE SURGERY    . TUBAL LIGATION       Family History  Problem Relation Age of Onset  . Stroke Mother   . High Cholesterol Mother   . Hypertension Mother   . Stroke Father   . High Cholesterol Father   . Hypertension Father   . High Cholesterol Sister       History  Drug Use No  ,  History  Alcohol Use  . 1.8 oz/week  . 3 Cans of beer per week  ,  History  Smoking Status  . Former Smoker  Smokeless Tobacco  . Never Used  ,    Current Outpatient Prescriptions on File Prior to Visit  Medication Sig Dispense Refill  . Cholecalciferol (VITAMIN D3) 5000 units TABS 5,000 IU OTC vitamin D3 daily. 90 tablet 3  . FLUoxetine (PROZAC) 40 MG capsule Take  1 capsule (40 mg total) by mouth daily. 90 capsule 3  . omeprazole (PRILOSEC) 20 MG capsule TAKE 1 CAPSULE BY MOUTH 2 TIMES DAILY. TAKE IN THE MORNING AND AT [facility] TIME. (Patient taking differently: TAKE 1 CAPSULE BY MOUTH DAILY) 180 capsule 1  . triamterene-hydrochlorothiazide (MAXZIDE) 75-50 MG tablet Take 1 tablet by mouth daily. 90 tablet 3   No current facility-administered medications on file prior to visit.      No Known Allergies   Review of Systems:   General:  Denies fever, chills Optho/Auditory:   Denies visual changes, blurred vision Respiratory:   Denies SOB, cough, wheeze, DIB  Cardiovascular:   Denies chest pain, palpitations, painful respirations Gastrointestinal:   Denies nausea, vomiting, diarrhea.  Endocrine:     Denies new hot or cold intolerance Musculoskeletal:  Denies joint swelling, gait issues, or new unexplained myalgias/ arthralgias Skin:  Denies rash, suspicious lesions  Neurological:    Denies dizziness, unexplained weakness, numbness  Psychiatric/Behavioral:   Denies mood changes  Objective:    Blood pressure 112/68, pulse 78, height 5' 3.25" (1.607 m), weight 164 lb 4.8 oz (74.5 kg), last menstrual period 07/26/2016.  Body mass index is 28.87 kg/m.  General: Well Developed, well nourished, and in no acute distress.  HEENT: Normocephalic, atraumatic, pupils equal round reactive to light, neck supple, No carotid bruits, no JVD Skin: Warm and dry, cap RF less 2 sec Cardiac: Regular rate and rhythm, S1, S2 WNL's, no murmurs rubs or  gallops Respiratory: ECTA B/L, Not using accessory muscles, speaking in full sentences. NeuroM-Sk: Ambulates w/o assistance, moves ext * 4 w/o difficulty, sensation grossly intact.  Ext: scant edema b/l lower ext Psych: No HI/SI, judgement and insight good, Euthymic mood. Full Affect.

## 2016-08-13 NOTE — Assessment & Plan Note (Addendum)
In 2011 - w Pulm Med- Dr Shelle Ironlance had tested pt and showed needed intervention.   At that time, pt said she was told she did not need any intervention and testing was ok.  - We will send for repeat sleep study/ testing now to cardiology/ sleep medicine

## 2016-08-13 NOTE — Patient Instructions (Addendum)
Melatonin oral solid dosage forms  3mg  tabs- 5mg  tabs- up to 10mg  if needed   BUY BLUE LIGHT BLOCKING GLASSES!!    If you have insomnia or difficulty sleeping, this information is for you:  - Avoid caffeinated beverages after lunch,  no alcoholic beverages,  no eating within 2-3 hours of lying down,  avoid exposure to blue light before bed,  avoid daytime naps, and  needs to maintain a regular sleep schedule- go to sleep and wake up around the same time every night.   - Resolve concerns or worries before entering bedroom:  Discussed relaxation techniques with patient and to keep a journal to write down fears\ worries.  I suggested seeing a counselor for CBT.   - Recommend patient meditate or do deep breathing exercises to help relax.   Incorporate the use of white noise machines or listen to "sleep meditation music", or recordings of guided meditations for sleep from YouTube which are free, such as  "guided meditation for detachment from over thinking"  by Ina KickMichael Sealey.        Risk factors for prediabetes and type 2 diabetes  Researchers don't fully understand why some people develop prediabetes and type 2 diabetes and others don't.  It's clear that certain factors increase the risk, however, including:  Weight. The more fatty tissue you have, the more resistant your cells become to insulin.  Inactivity. The less active you are, the greater your risk. Physical activity helps you control your weight, uses up glucose as energy and makes your cells more sensitive to insulin.  Family history. Your risk increases if a parent or sibling has type 2 diabetes.  Race. Although it's unclear why, people of certain races - including blacks, Hispanics, American Indians and Asian-Americans - are at higher risk.  Age. Your risk increases as you get older. This may be because you tend to exercise less, lose muscle mass and gain weight as you age. But type 2 diabetes is also increasing dramatically among  children, adolescents and younger adults.  Gestational diabetes. If you developed gestational diabetes when you were pregnant, your risk of developing prediabetes and type 2 diabetes later increases. If you gave birth to a baby weighing more than 9 pounds (4 kilograms), you're also at risk of type 2 diabetes.  Polycystic ovary syndrome. For women, having polycystic ovary syndrome - a common condition characterized by irregular menstrual periods, excess hair growth and obesity - increases the risk of diabetes.  High blood pressure. Having blood pressure over 140/90 millimeters of mercury (mm Hg) is linked to an increased risk of type 2 diabetes.  Abnormal cholesterol and triglyceride levels. If you have low levels of high-density lipoprotein (HDL), or "good," cholesterol, your risk of type 2 diabetes is higher. Triglycerides are another type of fat carried in the blood. People with high levels of triglycerides have an increased risk of type 2 diabetes. Your doctor can let you know what your cholesterol and triglyceride levels are.  A good guide to good carbs: The glycemic index ---If you have diabetes, or at risk for diabetes, you know all too well that when you eat carbohydrates, your blood sugar goes up. The total amount of carbs you consume at a meal or in a snack mostly determines what your blood sugar will do. But the food itself also plays a role. A serving of white rice has almost the same effect as eating pure table sugar - a quick, high spike in blood sugar. A serving  of lentils has a slower, smaller effect.  ---Picking good sources of carbs can help you control your blood sugar and your weight. Even if you don't have diabetes, eating healthier carbohydrate-rich foods can help ward off a host of chronic conditions, from heart disease to various cancers to, well, diabetes.  ---One way to choose foods is with the glycemic index (GI). This tool measures how much a food boosts blood sugar.  The  glycemic index rates the effect of a specific amount of a food on blood sugar compared with the same amount of pure glucose. A food with a glycemic index of 28 boosts blood sugar only 28% as much as pure glucose. One with a GI of 95 acts like pure glucose.    High glycemic foods result in a quick spike in insulin and blood sugar (also known as blood glucose).  Low glycemic foods have a slower, smaller effect- these are healthier for you.   Using the glycemic index Using the glycemic index is easy: choose foods in the low GI category instead of those in the high GI category (see below), and go easy on those in between. Low glycemic index (GI of 55 or less): Most fruits and vegetables, beans, minimally processed grains, pasta, low-fat dairy foods, and nuts.  Moderate glycemic index (GI 56 to 69): White and sweet potatoes, corn, white rice, couscous, breakfast cereals such as Cream of Wheat and Mini Wheats.  High glycemic index (GI of 70 or higher): White bread, rice cakes, most crackers, bagels, cakes, doughnuts, croissants, most packaged breakfast cereals. You can see the values for 100 commons foods and get links to more at www.health.RecordDebt.hu.  Swaps for lowering glycemic index  Instead of this high-glycemic index food Eat this lower-glycemic index food  White rice Brown rice or converted rice  Instant oatmeal Steel-cut oats  Cornflakes Bran flakes  Baked potato Pasta, bulgur  White bread Whole-grain bread  Corn Peas or leafy greens       Prediabetes Eating Plan  Prediabetes--also called impaired glucose tolerance or impaired fasting glucose--is a condition that causes blood sugar (blood glucose) levels to be higher than normal. Following a healthy diet can help to keep prediabetes under control. It can also help to lower the risk of type 2 diabetes and heart disease, which are increased in people who have prediabetes. Along with regular exercise, a healthy diet:  Promotes  weight loss.  Helps to control blood sugar levels.  Helps to improve the way that the body uses insulin.   WHAT DO I NEED TO KNOW ABOUT THIS EATING PLAN?   Use the glycemic index (GI) to plan your meals. The index tells you how quickly a food will raise your blood sugar. Choose low-GI foods. These foods take a longer time to raise blood sugar.  Pay close attention to the amount of carbohydrates in the food that you eat. Carbohydrates increase blood sugar levels.  Keep track of how many calories you take in. Eating the right amount of calories will help you to achieve a healthy weight. Losing about 7 percent of your starting weight can help to prevent type 2 diabetes.  You may want to follow a Mediterranean diet. This diet includes a lot of vegetables, lean meats or fish, whole grains, fruits, and healthy oils and fats.   WHAT FOODS CAN I EAT?  Grains Whole grains, such as whole-wheat or whole-grain breads, crackers, cereals, and pasta. Unsweetened oatmeal. Bulgur. Barley. Quinoa. Brown rice. Corn or whole-wheat  flour tortillas or taco shells. Vegetables Lettuce. Spinach. Peas. Beets. Cauliflower. Cabbage. Broccoli. Carrots. Tomatoes. Squash. Eggplant. Herbs. Peppers. Onions. Cucumbers. Brussels sprouts. Fruits Berries. Bananas. Apples. Oranges. Grapes. Papaya. Mango. Pomegranate. Kiwi. Grapefruit. Cherries. Meats and Other Protein Sources Seafood. Lean meats, such as chicken and [place] or lean cuts of pork and beef. Tofu. Eggs. Nuts. Beans. Dairy Low-fat or fat-free dairy products, such as yogurt, cottage cheese, and cheese. Beverages Water. Tea. Coffee. Sugar-free or diet soda. Seltzer water. Milk. Milk alternatives, such as soy or almond milk. Condiments Mustard. Relish. Low-fat, low-sugar ketchup. Low-fat, low-sugar barbecue sauce. Low-fat or fat-free mayonnaise. Sweets and Desserts Sugar-free or low-fat pudding. Sugar-free or low-fat ice cream and other frozen treats. Fats  and Oils Avocado. Walnuts. Olive oil. The items listed above may not be a complete list of recommended foods or beverages. Contact your dietitian for more options.    WHAT FOODS ARE NOT RECOMMENDED?  Grains Refined white flour and flour products, such as bread, pasta, snack foods, and cereals. Beverages Sweetened drinks, such as sweet iced tea and soda. Sweets and Desserts Baked goods, such as cake, cupcakes, pastries, cookies, and cheesecake. The items listed above may not be a complete list of foods and beverages to avoid. Contact your dietitian for more information.   This information is not intended to replace advice given to you by your health care provider. Make sure you discuss any questions you have with your health care provider.   Document Released: 08/06/2014 Document Reviewed: 08/06/2014 Elsevier Interactive Patient Education [employer]

## 2016-08-14 LAB — BASIC METABOLIC PANEL
BUN/Creatinine Ratio: 16 (ref 9–23)
BUN: 14 mg/dL (ref 6–24)
CO2: 29 mmol/L (ref 18–29)
CREATININE: 0.9 mg/dL (ref 0.57–1.00)
Calcium: 9.3 mg/dL (ref 8.7–10.2)
Chloride: 100 mmol/L (ref 96–106)
GFR, EST AFRICAN AMERICAN: 86 mL/min/{1.73_m2} (ref >59)
GFR, EST NON AFRICAN AMERICAN: 74 mL/min/{1.73_m2} (ref >59)
Glucose: 105 mg/dL — ABNORMAL HIGH (ref 65–99)
POTASSIUM: 4.5 mmol/L (ref 3.5–5.2)
SODIUM: 142 mmol/L (ref 134–144)

## 2016-08-14 LAB — VITAMIN D 25 HYDROXY (VIT D DEFICIENCY, FRACTURES): Vit D, 25-Hydroxy: 55.6 ng/mL (ref 30.0–100.0)

## 2016-08-18 ENCOUNTER — Telehealth: Payer: Self-pay | Admitting: Family Medicine

## 2016-08-18 NOTE — Telephone Encounter (Signed)
She missed your phone call about labs and said you can call her back when you are available at [phone]

## 2016-08-18 NOTE — Telephone Encounter (Signed)
See lab result notes.  Tiajuana Amass. Myron Stankovich, CMA

## 2016-08-30 NOTE — Assessment & Plan Note (Signed)
Check labs - FLP near future/ next OV- about one yr from when last checked

## 2016-08-30 NOTE — Assessment & Plan Note (Signed)
BMI counseling done

## 2016-08-30 NOTE — Assessment & Plan Note (Signed)
Cont 20-40mg  QD- depending on stress levels  Reminded pt that exercise, diet and lifestyle mod are important in treating mood d/o as well.  rec counseling

## 2016-08-30 NOTE — Assessment & Plan Note (Signed)
Well-controlled. Continue current medications.    Check BMP;   RF's given prn

## 2016-08-30 NOTE — Assessment & Plan Note (Signed)
A1c today was 6.2 again today  -Counseling done regarding disease process and various treatment options.  All questions answered  Handouts given - extensive info on diet/ lifestyle mod

## 2016-08-30 NOTE — Assessment & Plan Note (Signed)
Will check labs--- cont supp unless we tell her otherwise

## 2016-12-17 ENCOUNTER — Ambulatory Visit: Payer: BLUE CROSS/BLUE SHIELD | Admitting: Family Medicine

## 2017-02-09 ENCOUNTER — Other Ambulatory Visit: Payer: Self-pay | Admitting: Family Medicine

## 2017-02-09 ENCOUNTER — Telehealth: Payer: Self-pay | Admitting: Family Medicine

## 2017-02-09 NOTE — Telephone Encounter (Signed)
Patient needs appointment for additional refills.  Patient will be called to make appointment

## 2017-02-09 NOTE — Telephone Encounter (Signed)
Provider required pt to establish appointment in 30dys-- cld listed cell phone# left pt message to contact office. --glh

## 2017-03-30 ENCOUNTER — Other Ambulatory Visit: Payer: Self-pay | Admitting: Family Medicine

## 2017-04-22 ENCOUNTER — Other Ambulatory Visit: Payer: Self-pay | Admitting: Family Medicine

## 2017-04-25 ENCOUNTER — Other Ambulatory Visit: Payer: Self-pay | Admitting: Family Medicine

## 2017-04-26 ENCOUNTER — Telehealth: Payer: Self-pay | Admitting: Family Medicine

## 2017-04-26 NOTE — Telephone Encounter (Signed)
Left message for pt to contact office for provider required OV on Rx refills. --glh

## 2017-05-12 ENCOUNTER — Telehealth: Payer: Self-pay | Admitting: Family Medicine

## 2017-05-12 ENCOUNTER — Other Ambulatory Visit: Payer: Self-pay

## 2017-05-12 DIAGNOSIS — I10 Essential (primary) hypertension: Secondary | ICD-10-CM

## 2017-05-12 MED ORDER — TRIAMTERENE-HCTZ 75-50 MG PO TABS: 1.0000 | ORAL_TABLET | Freq: Every day | ORAL | 0 refills | Status: DC

## 2017-05-12 MED ORDER — FLUOXETINE HCL 40 MG PO CAPS: 40.0000 mg | ORAL_CAPSULE | Freq: Every day | ORAL | 0 refills | Status: DC

## 2017-05-12 NOTE — Telephone Encounter (Incomplete)
Pt called to rescheduled appointment missed due to Provider OOO--- says now she is out of meds and needs  Rx refills for:  FLUoxetine (PROZAC) 40 MG capsule [[id number]][[id number]]  Order Details  Dose, Route, Frequency: As Directed   Dispense Quantity: 15 capsule Refills: 0 Fills remaining: --        Sig: TAKE 1 CAPSULE BY MOUTH DAILY. ***NEEDS APPOINTMENT FOR ADDITIONAL REFILLS***       Written Date: 04/25/17 Expiration Date: 04/25/18    Start Date: 04/25/17 End Date: --         Ordering Provider:  Thomasene Lotpalski, Deborah, DO DEA #:  [id number] NPI:  [id number]      &   triamterene-hydrochlorothiazide (MAXZIDE) 75-50 MG tablet [[id number]][[id number]]  Order Details  Dose: 1 tablet Route: Oral Frequency: Daily  Dispense Quantity: 30 tablet Refills: 0 Fills remaining: --        Sig: Take 1 tablet by mouth daily. PATIENT MUST HAVE OFFICE VISIT PRIOR TO ANY FURTHER REFILLS       Written Date: 03/30/17 Expiration Date: 03/30/18    Start Date: 03/30/17 End Date: --         Ordering Provider:  Thomasene Lotpalski, Deborah, DO DEA #:  [id number] NPI:  [id number]      --Patient uses:  Preferred Pharmacies      [facility] [place] - [address] [phone] (Phone) [phone] (Fax)   -----glh

## 2017-05-13 NOTE — Telephone Encounter (Signed)
Medication was sent to the pharmacy.  Called patient to notify, left message for her to call back. MPulliam, CMA/RT(R)

## 2017-05-26 ENCOUNTER — Other Ambulatory Visit: Payer: Self-pay | Admitting: Family Medicine

## 2017-06-02 ENCOUNTER — Ambulatory Visit: Payer: Managed Care, Other (non HMO) | Admitting: Family Medicine

## 2017-06-02 DIAGNOSIS — E559 Vitamin D deficiency, unspecified: Secondary | ICD-10-CM | POA: Diagnosis not present

## 2017-06-02 DIAGNOSIS — F341 Dysthymic disorder: Secondary | ICD-10-CM | POA: Diagnosis not present

## 2017-06-02 DIAGNOSIS — R7303 Prediabetes: Secondary | ICD-10-CM | POA: Diagnosis not present

## 2017-06-02 DIAGNOSIS — E782 Mixed hyperlipidemia: Secondary | ICD-10-CM | POA: Diagnosis not present

## 2017-06-02 DIAGNOSIS — I10 Essential (primary) hypertension: Secondary | ICD-10-CM

## 2017-06-02 DIAGNOSIS — E663 Overweight: Secondary | ICD-10-CM

## 2017-06-02 MED ORDER — FLUOXETINE HCL 40 MG PO CAPS: 40.0000 mg | ORAL_CAPSULE | Freq: Every day | ORAL | 1 refills | Status: DC

## 2017-06-02 MED ORDER — OMEPRAZOLE 20 MG PO CPDR: 20.0000 mg | DELAYED_RELEASE_CAPSULE | Freq: Every day | ORAL | 1 refills | Status: DC

## 2017-06-02 MED ORDER — VITAMIN D (ERGOCALCIFEROL) 1.25 MG (50000 UNIT) PO CAPS: 50000.0000 [IU] | ORAL_CAPSULE | ORAL | 10 refills | Status: DC

## 2017-06-02 MED ORDER — TRIAMTERENE-HCTZ 75-50 MG PO TABS: 1.0000 | ORAL_TABLET | Freq: Every day | ORAL | 0 refills | Status: DC

## 2017-06-02 NOTE — Progress Notes (Signed)
Impression and Recommendations:    1. Prediabetes   2. Essential hypertension   3. Mixed hyperlipidemia   4. Overweight (BMI 25.0-29.9)   5. Dysthymia   6. Vitamin D deficiency     1. Prediabetes- A1c 9 months ago was 6.2. Recheck in near future  2. HTN:  -Discussed DASH diet. -Pt has been compliant with her meds and tolerating them well. Sx stable at this time. Continue meds as listed below.  -Check your BP at home and keep a BP log at home. Bring this into next OV. -Goal BP: <130/80.   3. HLD- check labs in near future.  4. Overweight- pt recommended to lose weight. -AHA  exercise guidelines discussed. Increasing blood flow to your legs will reduce the swelling in your legs, as well as improve your mood.  5. Dysthymia- Pt is stable at this time and tolerating her medications well. She had been without this medication due to a scheduling conflict where she was unable to be seen and her meds refilled. She declines any changes to medications right now as her main stressor for her is her new job that is very stressful.  -refill meds today. -deep breathing and square breathing techniques discussed. Do this 3x a day, 10 minutes at a time.  -Discussed brain exercises she can do to calm her down and feel more relaxed.  6. Vit D deficiency- Pt is compliant with her supplements. Sx stable. Continue taking this supplement as listed below. -order labs in near future.     Education and routine counseling performed. Handouts provided.  Orders Placed This Encounter  Procedures  . CBC with Differential/Platelet  . Comprehensive metabolic panel  . Hemoglobin A1c  . Lipid panel  . TSH  . T4, free  . Magnesium  . Phosphorus  . VITAMIN D 25 Hydroxy (Vit-D Deficiency, Fractures)    Meds ordered this encounter  Medications  . Vitamin D, Ergocalciferol, (DRISDOL) 50000 units CAPS capsule    Sig: Take 1 capsule (50,000 Units total) by mouth every 7 (seven) days. Take for 8 total  doses(weeks)    Dispense:  12 capsule    Refill:  10  . triamterene-hydrochlorothiazide (MAXZIDE) 75-50 MG tablet    Sig: Take 1 tablet by mouth daily. PATIENT MUST HAVE OFFICE VISIT PRIOR TO ANY FURTHER REFILLS    Dispense:  30 tablet    Refill:  0  . FLUoxetine (PROZAC) 40 MG capsule    Sig: Take 1 capsule (40 mg total) by mouth daily.    Dispense:  90 capsule    Refill:  1  . omeprazole (PRILOSEC) 20 MG capsule    Sig: Take 1 capsule (20 mg total) by mouth daily.    Dispense:  90 capsule    Refill:  1    Gross side effects, risk and benefits, and alternatives of medications and treatment plan in general discussed with patient.  Patient is aware that all medications have potential side effects and we are unable to predict every side effect or drug-drug interaction that may occur.   Patient will call with any questions prior to using medication if they have concerns.  Expresses verbal understanding and consents to current therapy and treatment regimen.  No barriers to understanding were identified.  Red flag symptoms and signs discussed in detail.  Patient expressed understanding regarding what to do in case of emergency\urgent symptoms  Please see AVS handed out to patient at the end of our visit for further  patient instructions/ counseling done pertaining to today's office visit.   Return for Follow-up chronic OV with me next available, FB W couple days prior.     Note: This document was prepared using Dragon voice recognition software and may include unintentional dictation errors.  This document serves as a record of services personally performed by Thomasene Lot, DO. It was created on her behalf by Thelma Barge, a trained medical scribe. The creation of this record is based on the scribe's personal observations and the provider's statements to them.   I have reviewed the above medical documentation for accuracy and completeness and I concur.  Thomasene Lot 06/02/17 5:28  PM  --------------------------------------------------------------------------------------------------------------------------------------------------------------------------------------------------------------------------------------------    Subjective:    CC:  Chief Complaint  Patient presents with  . Follow-up    HPI: Leah Jimenez is a 52 y.o. female who presents to [facility] today for follow-up of mood.   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 She is taking 40 mg prozac daily. She denies any new anxiety or depression, but is stressed. She does not think medications are needed at this time, just that she needs time to learn her new role. She recently ran out of her prozac medications due to a scheduling issue where she was not able to be seen. She has not been taking this for a few days as a result.   HTN HPI:  -  Her blood pressure has not been controlled at home.  Pt is not checking it at home. Her BP has been higher due to this recent new job.   She has been on Maxzide for 5-6 years after said she was retaining too much fluid.  -drink adequate amounts of water, equal to one half of your body weight in oz per day.  She has been having HA's recently, but states she went to an urgent care for itchy eyes who mentioned her BP was a little elevated recently. She does not know what her BP was at time.   - Patient reports good compliance with blood pressure medications  - Denies medication S-E   - Smoking Status noted   - She denies new onset of: chest pain, exercise intolerance, shortness of breath, dizziness, visual changes, lower extremity swelling or claudication.   Last 3 blood pressure readings in our office are as follows: BP Readings from Last 3 Encounters:  08/13/16 112/68  03/12/16 106/68  01/30/16 113/70    Pre DM: A1c 9 months ago was 6.2. She has a  FMHx of DM2.  There were no vitals filed for this visit.  Depression screen [facility] 2/9 06/02/2017 06/02/2017 08/13/2016  Decreased Interest 0 0 0  Down, Depressed, Hopeless 0 0 1  PHQ - 2 Score 0 0 1  Altered sleeping 0 - 3  Tired, decreased energy 0 - 3  Change in appetite 0 - 3  Feeling bad or failure about yourself  0 - 3  Trouble concentrating 0 - 0  Moving slowly or fidgety/restless 0 - 3  Suicidal thoughts 0 - 1  PHQ-9 Score 0 - 17  Difficult doing work/chores Not difficult at all - -     No flowsheet data found.   Wt Readings from Last 3 Encounters:  08/13/16 164 lb 4.8 oz (74.5 kg)  03/12/16 164 lb 4.8 oz (74.5 kg)  01/30/16 163 lb 11.2 oz (74.3 kg)  BP Readings from Last 3 Encounters:  08/13/16 112/68  03/12/16 106/68  01/30/16 113/70   Pulse Readings from Last 3 Encounters:  08/13/16 78  03/12/16 69  01/30/16 73   BMI Readings from Last 3 Encounters:  08/13/16 28.87 kg/m  03/12/16 28.87 kg/m  01/30/16 28.77 kg/m         Patient Care Team    Relationship Specialty Notifications Start End  Thomasene Lot, DO PCP - General Family Medicine  10/01/15      Patient Active Problem List   Diagnosis Date Noted  . Prediabetes 10/22/2015    Priority: High  . Essential hypertension 10/22/2015    Priority: High  . HLD: elevated LDL 10/22/2015    Priority: High  . Overweight (BMI 25.0-29.9) 10/01/2015    Priority: High  . Dysthymia 12/15/2008    Priority: High  . GERD (gastroesophageal reflux disease) 01/30/2016    Priority: Medium  . OSA on CPAP 03/06/2009    Priority: Medium  . HYPERSOMNIA 12/26/2008    Priority: Medium  . Vitamin D deficiency 10/22/2015    Priority: Low  . ALLERGIC RHINITIS 12/26/2008  . Constipation 12/13/2008  . RECTAL BLEEDING 12/13/2008    Past Medical history, Surgical history, Family history, Social history, Allergies and Medications have been entered into the medical record, reviewed and changed as needed.     Current Meds  Medication Sig  . FLUoxetine (PROZAC) 40 MG capsule Take 1 capsule (40 mg total) by mouth daily.  Marland Kitchen omeprazole (PRILOSEC) 20 MG capsule Take 1 capsule (20 mg total) by mouth daily.  Marland Kitchen triamterene-hydrochlorothiazide (MAXZIDE) 75-50 MG tablet Take 1 tablet by mouth daily. PATIENT MUST HAVE OFFICE VISIT PRIOR TO ANY FURTHER REFILLS  . Vitamin D, Ergocalciferol, (DRISDOL) 50000 units CAPS capsule Take 1 capsule (50,000 Units total) by mouth every 7 (seven) days. Take for 8 total doses(weeks)  . [DISCONTINUED] FLUoxetine (PROZAC) 40 MG capsule Take 1 capsule (40 mg total) by mouth daily.  . [DISCONTINUED] omeprazole (PRILOSEC) 20 MG capsule TAKE 1 CAPSULE BY MOUTH 2 TIMES DAILY. TAKE IN THE MORNING AND AT [facility] TIME. (Patient taking differently: TAKE 1 CAPSULE BY MOUTH DAILY)  . [DISCONTINUED] triamterene-hydrochlorothiazide (MAXZIDE) 75-50 MG tablet Take 1 tablet by mouth daily. PATIENT MUST HAVE OFFICE VISIT PRIOR TO ANY FURTHER REFILLS  . [DISCONTINUED] Vitamin D, Ergocalciferol, (DRISDOL) 50000 units CAPS capsule Take 1 capsule (50,000 Units total) by mouth every 7 (seven) days. Take for 8 total doses(weeks)    Allergies:  No Known Allergies   Review of Systems: Review of Systems: General:   No F/C, wt loss Pulm:   No DIB, SOB, pleuritic chest pain Card:  No CP, palpitations Abd:  No n/v/d or pain Ext:  No inc edema from baseline Psych: no SI/ HI    Objective:   There were no vitals taken for this visit. There is no height or weight on file to calculate BMI. General:  Well Developed, well nourished, appropriate for stated age.  Neuro:  Alert and oriented,  extra-ocular muscles intact  HEENT:  Normocephalic, atraumatic, neck supple, no carotid bruits appreciated  Skin:  no gross rash, warm, pink. Cardiac:  RRR, S1 S2 Respiratory:  ECTA B/L and A/P, Not using accessory muscles, speaking in full sentences- unlabored. Vascular:  Ext warm, no cyanosis apprec.; cap  RF less 2 sec. Psych:  No HI/SI, judgement and insight good, Euthymic mood. Full Affect.

## 2017-06-02 NOTE — Patient Instructions (Addendum)
-Do the Cook's hook up with square breathing 3x/day, for ten minutes at a time, or when you are feeling particularly stressed out.  In the near future if you are going to make a follow-up appointment with me my next available chronic follow-up then once that appointment is made you can ask Dondra SpryGail to make an appointment for fasting blood work a couple of days prior.  Please check your blood pressure at home and write it down what it is running.  You should be sitting quietly for 15-20 minutes prior to checking it and have not had any caffeine or anything that is going to agitate your elevated falsely  The American Heart Association website is a great website to learn more about prediabetes or diabetes, hypertension or blood pressure as well as cholesterol or hyperlipidemia.  Their website is www.heart.org     Managing Your Hypertension Hypertension is commonly called high blood pressure. This is when the force of your blood pressing against the walls of your arteries is too strong. Arteries are blood vessels that carry blood from your heart throughout your body. Hypertension forces the heart to work harder to pump blood, and may cause the arteries to become narrow or stiff. Having untreated or uncontrolled hypertension can cause heart attack, stroke, kidney disease, and other problems. What are blood pressure readings? A blood pressure reading consists of a higher number over a lower number. Ideally, your blood pressure should be below 120/80. The first ("top") number is called the systolic pressure. It is a measure of the pressure in your arteries as your heart beats. The second ("bottom") number is called the diastolic pressure. It is a measure of the pressure in your arteries as the heart relaxes. What does my blood pressure reading mean? Blood pressure is classified into four stages. Based on your blood pressure reading, your health care provider may use the following stages to determine what type  of treatment you need, if any. Systolic pressure and diastolic pressure are measured in a unit called mm Hg. Normal  Systolic pressure: below 120.  Diastolic pressure: below 80. Elevated  Systolic pressure: 120-129.  Diastolic pressure: below 80. Hypertension stage 1  Systolic pressure: 130-139.  Diastolic pressure: 80-89. Hypertension stage 2  Systolic pressure: 140 or above.  Diastolic pressure: 90 or above. What health risks are associated with hypertension? Managing your hypertension is an important responsibility. Uncontrolled hypertension can lead to:  A heart attack.  A stroke.  A weakened blood vessel (aneurysm).  Heart failure.  Kidney damage.  Eye damage.  Metabolic syndrome.  Memory and concentration problems.  What changes can I make to manage my hypertension? Hypertension can be managed by making lifestyle changes and possibly by taking medicines. Your health care provider will help you make a plan to bring your blood pressure within a normal range. Eating and drinking  Eat a diet that is high in fiber and potassium, and low in salt (sodium), added sugar, and fat. An example eating plan is called the DASH (Dietary Approaches to Stop Hypertension) diet. To eat this way: ? Eat plenty of fresh fruits and vegetables. Try to fill half of your plate at each meal with fruits and vegetables. ? Eat whole grains, such as whole wheat pasta, brown rice, or whole grain bread. Fill about one quarter of your plate with whole grains. ? Eat low-fat diary products. ? Avoid fatty cuts of meat, processed or cured meats, and poultry with skin. Fill about one quarter of your  plate with lean proteins such as fish, chicken without skin, beans, eggs, and tofu. ? Avoid premade and processed foods. These tend to be higher in sodium, added sugar, and fat.  Reduce your daily sodium intake. Most people with hypertension should eat less than 1,500 mg of sodium a day.  Limit alcohol  intake to no more than 1 drink a day for nonpregnant women and 2 drinks a day for men. One drink equals 12 oz of beer, 5 oz of wine, or 1 oz of hard liquor. Lifestyle  Work with your health care provider to maintain a healthy body weight, or to lose weight. Ask what an ideal weight is for you.  Get at least 30 minutes of exercise that causes your heart to beat faster (aerobic exercise) most days of the week. Activities may include walking, swimming, or biking.  Include exercise to strengthen your muscles (resistance exercise), such as weight lifting, as part of your weekly exercise routine. Try to do these types of exercises for 30 minutes at least 3 days a week.  Do not use any products that contain nicotine or tobacco, such as cigarettes and e-cigarettes. If you need help quitting, ask your health care provider.  Control any long-term (chronic) conditions you have, such as high cholesterol or diabetes. Monitoring  Monitor your blood pressure at home as told by your health care provider. Your personal target blood pressure may vary depending on your medical conditions, your age, and other factors.  Have your blood pressure checked regularly, as often as told by your health care provider. Working with your health care provider  Review all the medicines you take with your health care provider because there may be side effects or interactions.  Talk with your health care provider about your diet, exercise habits, and other lifestyle factors that may be contributing to hypertension.  Visit your health care provider regularly. Your health care provider can help you create and adjust your plan for managing hypertension. Will I need medicine to control my blood pressure? Your health care provider may prescribe medicine if lifestyle changes are not enough to get your blood pressure under control, and if:  Your systolic blood pressure is 130 or higher.  Your diastolic blood pressure is 80 or  higher.  Take medicines only as told by your health care provider. Follow the directions carefully. Blood pressure medicines must be taken as prescribed. The medicine does not work as well when you skip doses. Skipping doses also puts you at risk for problems. Contact a health care provider if:  You think you are having a reaction to medicines you have taken.  You have repeated (recurrent) headaches.  You feel dizzy.  You have swelling in your ankles.  You have trouble with your vision. Get help right away if:  You develop a severe headache or confusion.  You have unusual weakness or numbness, or you feel faint.  You have severe pain in your chest or abdomen.  You vomit repeatedly.  You have trouble breathing. Summary  Hypertension is when the force of blood pumping through your arteries is too strong. If this condition is not controlled, it may put you at risk for serious complications.  Your personal target blood pressure may vary depending on your medical conditions, your age, and other factors. For most people, a normal blood pressure is less than 120/80.  Hypertension is managed by lifestyle changes, medicines, or both. Lifestyle changes include weight loss, eating a healthy, low-sodium diet, exercising  more, and limiting alcohol. This information is not intended to replace advice given to you by your health care provider. Make sure you discuss any questions you have with your health care provider. Document Released: 12/15/2011 Document Revised: 02/18/2016 Document Reviewed: 02/18/2016 Elsevier Interactive Patient Education  2018 [employer].      DASH Eating Plan DASH stands for "Dietary Approaches to Stop Hypertension." The DASH eating plan is a healthy eating plan that has been shown to reduce high blood pressure (hypertension). It may also reduce your risk for type 2 diabetes, heart disease, and stroke. The DASH eating plan may also help with weight loss. What are  tips for following this plan? General guidelines  Avoid eating more than 2,300 mg (milligrams) of salt (sodium) a day. If you have hypertension, you may need to reduce your sodium intake to 1,500 mg a day.  Limit alcohol intake to no more than 1 drink a day for nonpregnant women and 2 drinks a day for men. One drink equals 12 oz of beer, 5 oz of wine, or 1 oz of hard liquor.  Work with your health care provider to maintain a healthy body weight or to lose weight. Ask what an ideal weight is for you.  Get at least 30 minutes of exercise that causes your heart to beat faster (aerobic exercise) most days of the week. Activities may include walking, swimming, or biking.  Work with your health care provider or diet and nutrition specialist (dietitian) to adjust your eating plan to your individual calorie needs. Reading food labels  Check food labels for the amount of sodium per serving. Choose foods with less than 5 percent of the Daily Value of sodium. Generally, foods with less than 300 mg of sodium per serving fit into this eating plan.  To find whole grains, look for the word "whole" as the first word in the ingredient list. Shopping  Buy products labeled as "low-sodium" or "no salt added."  Buy fresh foods. Avoid canned foods and premade or frozen meals. Cooking  Avoid adding salt when cooking. Use salt-free seasonings or herbs instead of table salt or sea salt. Check with your health care provider or pharmacist before using salt substitutes.  Do not fry foods. Cook foods using healthy methods such as baking, boiling, grilling, and broiling instead.  Cook with heart-healthy oils, such as olive, canola, soybean, or sunflower oil. Meal planning   Eat a balanced diet that includes: ? 5 or more servings of fruits and vegetables each day. At each meal, try to fill half of your plate with fruits and vegetables. ? Up to 6-8 servings of whole grains each day. ? Less than 6 oz of lean  meat, poultry, or fish each day. A 3-oz serving of meat is about the same size as a deck of cards. One egg equals 1 oz. ? 2 servings of low-fat dairy each day. ? A serving of nuts, seeds, or beans 5 times each week. ? Heart-healthy fats. Healthy fats called Omega-3 fatty acids are found in foods such as flaxseeds and coldwater fish, like sardines, salmon, and mackerel.  Limit how much you eat of the following: ? Canned or prepackaged foods. ? Food that is high in trans fat, such as fried foods. ? Food that is high in saturated fat, such as fatty meat. ? Sweets, desserts, sugary drinks, and other foods with added sugar. ? Full-fat dairy products.  Do not salt foods before eating.  Try to eat at least  2 vegetarian meals each week.  Eat more home-cooked food and less restaurant, buffet, and fast food.  When eating at a restaurant, ask that your food be prepared with less salt or no salt, if possible. What foods are recommended? The items listed may not be a complete list. Talk with your dietitian about what dietary choices are best for you. Grains Whole-grain or whole-wheat bread. Whole-grain or whole-wheat pasta. Brown rice. Orpah Cobb. Bulgur. Whole-grain and low-sodium cereals. Pita bread. Low-fat, low-sodium crackers. Whole-wheat flour tortillas. Vegetables Fresh or frozen vegetables (raw, steamed, roasted, or grilled). Low-sodium or reduced-sodium tomato and vegetable juice. Low-sodium or reduced-sodium tomato sauce and tomato paste. Low-sodium or reduced-sodium canned vegetables. Fruits All fresh, dried, or frozen fruit. Canned fruit in natural juice (without added sugar). Meat and other protein foods Skinless chicken or [place]. Ground chicken or [place]. Pork with fat trimmed off. Fish and seafood. Egg whites. Dried beans, peas, or lentils. Unsalted nuts, nut butters, and seeds. Unsalted canned beans. Lean cuts of beef with fat trimmed off. Low-sodium, lean deli  meat. Dairy Low-fat (1%) or fat-free (skim) milk. Fat-free, low-fat, or reduced-fat cheeses. Nonfat, low-sodium ricotta or cottage cheese. Low-fat or nonfat yogurt. Low-fat, low-sodium cheese. Fats and oils Soft margarine without trans fats. Vegetable oil. Low-fat, reduced-fat, or light mayonnaise and salad dressings (reduced-sodium). Canola, safflower, olive, soybean, and sunflower oils. Avocado. Seasoning and other foods Herbs. Spices. Seasoning mixes without salt. Unsalted popcorn and pretzels. Fat-free sweets. What foods are not recommended? The items listed may not be a complete list. Talk with your dietitian about what dietary choices are best for you. Grains Baked goods made with fat, such as croissants, muffins, or some breads. Dry pasta or rice meal packs. Vegetables Creamed or fried vegetables. Vegetables in a cheese sauce. Regular canned vegetables (not low-sodium or reduced-sodium). Regular canned tomato sauce and paste (not low-sodium or reduced-sodium). Regular tomato and vegetable juice (not low-sodium or reduced-sodium). Rosita Fire. Olives. Fruits Canned fruit in a light or heavy syrup. Fried fruit. Fruit in cream or butter sauce. Meat and other protein foods Fatty cuts of meat. Ribs. Fried meat. Tomasa Blase. Sausage. Bologna and other processed lunch meats. Salami. Fatback. Hotdogs. Bratwurst. Salted nuts and seeds. Canned beans with added salt. Canned or smoked fish. Whole eggs or egg yolks. Chicken or [place] with skin. Dairy Whole or 2% milk, cream, and half-and-half. Whole or full-fat cream cheese. Whole-fat or sweetened yogurt. Full-fat cheese. Nondairy creamers. Whipped toppings. Processed cheese and cheese spreads. Fats and oils Butter. Stick margarine. Lard. Shortening. Ghee. Bacon fat. Tropical oils, such as coconut, palm kernel, or palm oil. Seasoning and other foods Salted popcorn and pretzels. Onion salt, garlic salt, seasoned salt, table salt, and sea salt. Worcestershire  sauce. Tartar sauce. Barbecue sauce. Teriyaki sauce. Soy sauce, including reduced-sodium. Steak sauce. Canned and packaged gravies. Fish sauce. Oyster sauce. Cocktail sauce. Horseradish that you find on the shelf. Ketchup. Mustard. Meat flavorings and tenderizers. Bouillon cubes. Hot sauce and Tabasco sauce. Premade or packaged marinades. Premade or packaged taco seasonings. Relishes. Regular salad dressings. Where to find more information:  National Heart, Lung, and Blood Institute: PopSteam.is  American Heart Association: www.heart.org Summary  The DASH eating plan is a healthy eating plan that has been shown to reduce high blood pressure (hypertension). It may also reduce your risk for type 2 diabetes, heart disease, and stroke.  With the DASH eating plan, you should limit salt (sodium) intake to 2,300 mg a day. If you have hypertension, you may  need to reduce your sodium intake to 1,500 mg a day.  When on the DASH eating plan, aim to eat more fresh fruits and vegetables, whole grains, lean proteins, low-fat dairy, and heart-healthy fats.  Work with your health care provider or diet and nutrition specialist (dietitian) to adjust your eating plan to your individual calorie needs. This information is not intended to replace advice given to you by your health care provider. Make sure you discuss any questions you have with your health care provider. Document Released: 03/11/2011 Document Revised: 03/15/2016 Document Reviewed: 03/15/2016 Elsevier Interactive Patient Education  [employer].

## 2017-06-28 ENCOUNTER — Other Ambulatory Visit: Payer: Managed Care, Other (non HMO)

## 2017-06-28 DIAGNOSIS — E782 Mixed hyperlipidemia: Secondary | ICD-10-CM

## 2017-06-28 DIAGNOSIS — I10 Essential (primary) hypertension: Secondary | ICD-10-CM

## 2017-06-28 DIAGNOSIS — E559 Vitamin D deficiency, unspecified: Secondary | ICD-10-CM

## 2017-06-28 DIAGNOSIS — F341 Dysthymic disorder: Secondary | ICD-10-CM

## 2017-06-28 DIAGNOSIS — R7303 Prediabetes: Secondary | ICD-10-CM

## 2017-06-28 DIAGNOSIS — E663 Overweight: Secondary | ICD-10-CM

## 2017-06-29 LAB — CBC WITH DIFFERENTIAL/PLATELET
BASOS ABS: 0 10*3/uL (ref 0.0–0.2)
Basos: 0 %
EOS (ABSOLUTE): 0.1 10*3/uL (ref 0.0–0.4)
Eos: 2 %
Hematocrit: 39.7 % (ref 34.0–46.6)
Hemoglobin: 12.7 g/dL (ref 11.1–15.9)
Immature Grans (Abs): 0 10*3/uL (ref 0.0–0.1)
Immature Granulocytes: 0 %
LYMPHS ABS: 1.4 10*3/uL (ref 0.7–3.1)
Lymphs: 31 %
MCH: 27.2 pg (ref 26.6–33.0)
MCHC: 32 g/dL (ref 31.5–35.7)
MCV: 85 fL (ref 79–97)
MONOS ABS: 0.4 10*3/uL (ref 0.1–0.9)
Monocytes: 8 %
NEUTROS PCT: 59 %
Neutrophils Absolute: 2.7 10*3/uL (ref 1.4–7.0)
Platelets: 342 10*3/uL (ref 150–379)
RBC: 4.67 x10E6/uL (ref 3.77–5.28)
RDW: 15.4 % (ref 12.3–15.4)
WBC: 4.6 10*3/uL (ref 3.4–10.8)

## 2017-06-29 LAB — LIPID PANEL
CHOL/HDL RATIO: 3 ratio (ref 0.0–4.4)
Cholesterol, Total: 238 mg/dL — ABNORMAL HIGH (ref 100–199)
HDL: 80 mg/dL (ref >39)
LDL CALC: 142 mg/dL — AB (ref 0–99)
TRIGLYCERIDES: 78 mg/dL (ref 0–149)
VLDL Cholesterol Cal: 16 mg/dL (ref 5–40)

## 2017-06-29 LAB — COMPREHENSIVE METABOLIC PANEL
ALBUMIN: 4.2 g/dL (ref 3.5–5.5)
ALK PHOS: 115 IU/L (ref 39–117)
ALT: 28 IU/L (ref 0–32)
AST: 25 IU/L (ref 0–40)
Albumin/Globulin Ratio: 1.7 (ref 1.2–2.2)
BILIRUBIN TOTAL: 0.3 mg/dL (ref 0.0–1.2)
BUN / CREAT RATIO: 15 (ref 9–23)
BUN: 14 mg/dL (ref 6–24)
CHLORIDE: 100 mmol/L (ref 96–106)
CO2: 29 mmol/L (ref 20–29)
Calcium: 9.6 mg/dL (ref 8.7–10.2)
Creatinine, Ser: 0.96 mg/dL (ref 0.57–1.00)
GFR calc Af Amer: 79 mL/min/{1.73_m2} (ref >59)
GFR calc non Af Amer: 68 mL/min/{1.73_m2} (ref >59)
GLUCOSE: 118 mg/dL — AB (ref 65–99)
Globulin, Total: 2.5 g/dL (ref 1.5–4.5)
Potassium: 3.7 mmol/L (ref 3.5–5.2)
SODIUM: 145 mmol/L — AB (ref 134–144)
Total Protein: 6.7 g/dL (ref 6.0–8.5)

## 2017-06-29 LAB — T4, FREE: FREE T4: 1.29 ng/dL (ref 0.82–1.77)

## 2017-06-29 LAB — TSH: TSH: 2.54 u[IU]/mL (ref 0.450–4.500)

## 2017-06-29 LAB — VITAMIN D 25 HYDROXY (VIT D DEFICIENCY, FRACTURES): Vit D, 25-Hydroxy: 56.7 ng/mL (ref 30.0–100.0)

## 2017-06-29 LAB — PHOSPHORUS: Phosphorus: 3.3 mg/dL (ref 2.5–4.5)

## 2017-06-29 LAB — MAGNESIUM: Magnesium: 1.7 mg/dL (ref 1.6–2.3)

## 2017-06-29 LAB — HEMOGLOBIN A1C
Est. average glucose Bld gHb Est-mCnc: 137 mg/dL
Hgb A1c MFr Bld: 6.4 % — ABNORMAL HIGH (ref 4.8–5.6)

## 2017-07-14 ENCOUNTER — Ambulatory Visit (INDEPENDENT_AMBULATORY_CARE_PROVIDER_SITE_OTHER): Payer: Managed Care, Other (non HMO) | Admitting: Family Medicine

## 2017-07-14 ENCOUNTER — Encounter: Payer: Self-pay | Admitting: Family Medicine

## 2017-07-14 VITALS — BP 135/86 | HR 67 | Ht 63.0 in | Wt 165.9 lb

## 2017-07-14 DIAGNOSIS — R7303 Prediabetes: Secondary | ICD-10-CM

## 2017-07-14 DIAGNOSIS — I10 Essential (primary) hypertension: Secondary | ICD-10-CM

## 2017-07-14 DIAGNOSIS — F341 Dysthymic disorder: Secondary | ICD-10-CM | POA: Diagnosis not present

## 2017-07-14 DIAGNOSIS — E663 Overweight: Secondary | ICD-10-CM

## 2017-07-14 DIAGNOSIS — E782 Mixed hyperlipidemia: Secondary | ICD-10-CM

## 2017-07-14 DIAGNOSIS — E559 Vitamin D deficiency, unspecified: Secondary | ICD-10-CM | POA: Diagnosis not present

## 2017-07-14 MED ORDER — FLUOXETINE HCL 60 MG PO TABS: ORAL_TABLET | ORAL | 3 refills | Status: DC

## 2017-07-14 NOTE — Patient Instructions (Addendum)
Salicylic acid advised for treatment of growth on left palm.  Use the max amount and use daily.  The most you can buy in a pharmacy is usually 20% but online I think you can buy up to 40.  Use it daily for 2-3 months or until the skin lesion is gone.  High fat, low-carb diet such as keto = no more than 30 grams of carbs per day total.  Download a free app such as LoseIt or MyFinessPal.  This will ask your age, height, and weight.  You will enter these and choose "I want to lose 1 pound per week," and the app will advise you on how many calories to eat per day.  We will increase your dose of Prozac from 40 to 60.  Pay attention - if you feel more sweaty, in a bothersome way, please let us know.  -Follow-up in 3 months for repeat A1c.  Behavior Modification Ideas for Weight Management  Weight management involves adopting a healthy lifestyle that includes a knowledge of nutrition and exercise, a positive attitude and the right kind of motivation. Internal motives such as better health, increased energy, self-esteem and personal control increase your chances of lifelong weight management success.  Remember to have realistic goals and think long-term success. Believe in yourself and you can do it. The following information will give you ideas to help you meet your goals.  Control Your Home Environment  Eat only while sitting down at the kitchen or dining room table. Do not eat while watching television, reading, cooking, talking on the phone, standing at the refrigerator or working on the computer. Keep tempting foods out of the house -- don't buy them. Keep tempting foods out of sight. Have low-calorie foods ready to eat. Unless you are preparing a meal, stay out of the kitchen. Have healthy snacks at your disposal, such as small pieces of fruit, vegetables, canned fruit, pretzels, low-fat string cheese and nonfat cottage cheese.  Control Your Work Environment  Do not eat at [employer] or keep  tempting snacks at your desk. If you get hungry between meals, plan healthy snacks and bring them with you to work. During your breaks, go for a walk instead of eating. If you work around food, plan in advance the one item you will eat at mealtime. Make it inconvenient to nibble on food by chewing gum, sugarless candy or drinking water or another low-calorie beverage. Do not work through meals. Skipping meals slows down metabolism and may result in overeating at the next meal. If food is available for special occasions, either pick the healthiest item, nibble on low-fat snacks brought from home, don't have anything offered, choose one option and have a small amount, or have only a beverage.  Control Your Mealtime Environment  Serve your plate of food at the stove or kitchen counter. Do not put the serving dishes on the table. If you do put dishes on the table, remove them immediately when finished eating. Fill half of your plate with vegetables, a quarter with lean protein and a quarter with starch. Use smaller plates, bowls and glasses. A smaller portion will look large when it is in a little dish. Politely refuse second helpings. When fixing your plate, limit portions of food to one scoop/serving or less.   Daily Food Management  Replace eating with another activity that you will not associate with food. Wait 20 minutes before eating something you are craving. Drink a large glass of water or  diet soda before eating. Always have a big glass or bottle of water to drink throughout the day. Avoid high-calorie add-ons such as cream with your coffee, butter, mayonnaise and salad dressings.  Shopping: Do not shop when hungry or tired. Shop from a list and avoid buying anything that is not on your list. If you must have tempting foods, buy individual-sized packages and try to find a lower-calorie alternative. Don't taste test in the store. Read food labels. Compare products to help you make  the healthiest choices.  Preparation: Chew a piece of gum while cooking meals. Use a quarter teaspoon if you taste test your food. Try to only fix what you are going to eat, leaving yourself no chance for seconds. If you have prepared more food than you need, portion it into individual containers and freeze or refrigerate immediately. Don't snack while cooking meals.  Eating: Eat slowly. Remember it takes about 20 minutes for your stomach to send a message to your brain that it is full. Don't let fake hunger make you think you need more. The ideal way to eat is to take a bite, put your utensil down, take a sip of water, cut your next bite, take a bit, put your utensil down and so on. Do not cut your food all at one time. Cut only as needed. Take small bites and chew your food well. Stop eating for a minute or two at least once during a meal or snack. Take breaks to reflect and have conversation.  Cleanup and Leftovers: Label leftovers for a specific meal or snack. Freeze or refrigerate individual portions of leftovers. Do not clean up if you are still hungry.  Eating Out and Social Eating  Do not arrive hungry. Eat something light before the meal. Try to fill up on low-calorie foods, such as vegetables and fruit, and eat smaller portions of the high-calorie foods. Eat foods that you like, but choose small portions. If you want seconds, wait at least 20 minutes after you have eaten to see if you are actually hungry or if your eyes are bigger than your stomach. Limit alcoholic beverages. Try a soda water with a twist of lime. Do not skip other meals in the day to save room for the special event.  At Restaurants: Order  la carte rather than buffet style. Order some vegetables or a salad for an appetizer instead of eating bread. If you order a high-calorie dish, share it with someone. Try an after-dinner mint with your coffee. If you do have dessert, share it with two or more  people. Don't overeat because you do not want to waste food. Ask for a doggie bag to take extra food home. Tell the server to put half of your entree in a to go bag before the meal is served to you. Ask for salad dressing, gravy or high-fat sauces on the side. Dip the tip of your fork in the dressing before each bite. If bread is served, ask for only one piece. Try it plain without butter or oil. At [employer] where oil and vinegar is served with bread, use only a small amount of oil and a lot of vinegar for dipping.  At a Friend's House: Offer to bring a dish, appetizer or dessert that is low in calories. Serve yourself small portions or tell the host that you only want a small amount. Stand or sit away from the snack table. Stay away from the kitchen or stay busy if you  are near the food. Limit your alcohol intake.  At [employer]: Cover most of your plate with lettuce and/or vegetables. Use a salad plate instead of a dinner plate. After eating, clear away your dishes before having coffee or tea.  Entertaining at Home: Explore low-fat, low-cholesterol cookbooks. Use single-serving foods like chicken breasts or hamburger patties. Prepare low-calorie appetizers and desserts.   Holidays: Keep tempting foods out of sight. Decorate the house without using food. Have low-calorie beverages and foods on hand for guests. Allow yourself one planned treat a day. Don't skip meals to save up for the holiday feast. Eat regular, planned meals.   Exercise Well  Make exercise a priority and a planned activity in the day. If possible, walk the entire or part of the distance to work. Get an exercise buddy. Go for a walk with a colleague during one of your breaks, go to the gym, run or take a walk with a friend, walk in the mall with a shopping companion. Park at the end of the parking lot and walk to the store or office entrance. Always take the stairs all of the way or at  least part of the way to your floor. If you have a desk job, walk around the office frequently. Do leg lifts while sitting at your desk. Do something outside on the weekends like going for a hike or a bike ride.   Have a Healthy Attitude  Make health your weight management priority. Be realistic. Have a goal to achieve a healthier you, not necessarily the lowest weight or ideal weight based on calculations or tables. Focus on a healthy eating style, not on dieting. Dieting usually lasts for a short amount of time and rarely produces long-term success. Think long term. You are developing new healthy behaviors to follow next month, in a year and in a decade.    This information is for educational purposes only and is not intended to replace the advice of your doctor or health care provider. We encourage you to discuss with your doctor any questions or concerns you may have.        Guidelines for Losing Weight   We want weight loss that will last so you should lose 1-2 pounds a week.  THAT IS IT! Please pick THREE things a month to change. Once it is a habit check off the item. Then pick another three items off the list to become habits.  If you are already doing a habit on the list GREAT!  Cross that item off!  Dont drink your calories. Ie, alcohol, soda, fruit juice, and sweet tea.   Drink more water. Drink a glass when you feel hungry or before each meal.   Eat breakfast - Complex carb and protein (likeDannon light and fit yogurt, oatmeal, fruit, eggs, [place] bacon).  Measure your cereal.  Eat no more than one cup a day. (ie Kashi)  Eat an apple a day.  Add a vegetable a day.  Try a new vegetable a month.  Use Pam! Stop using oil or butter to cook.  Dont finish your plate or use smaller plates.  Share your dessert.  Eat sugar free Jello for dessert or frozen grapes.  Dont eat 2-3 hours before bed.  Switch to whole wheat bread, pasta, and brown rice.  Make  healthier choices when you eat out. No fries!  Pick baked chicken, NOT fried.  Dont forget to SLOW DOWN when you eat. It is not going  anywhere.   Take the stairs.  Park far away in the parking lot  Lift soup cans (or weights) for 10 minutes while watching TV.  Walk at work for 10 minutes during break.  Walk outside 1 time a week with your friend, kids, dog, or significant other.  Start a walking group at church.  Walk the mall as much as you can tolerate.   Keep a food diary.  Weigh yourself daily.  Walk for 15 minutes 3 days per week.  Cook at home more often and eat out less. If life happens and you go back to old habits, it is okay.  Just start over. You can do it!  If you experience chest pain, get short of breath, or tired during the exercise, please stop immediately and inform your doctor.    Before you even begin to attack a weight-loss plan, it pays to remember this: You are not fat. You have fat. Losing weight isn't about blame or shame; it's simply another achievement to accomplish. Dieting is like any other skill--you have to buckle down and work at it. As long as you act in a smart, reasonable way, you'll ultimately get where you want to be. Here are some weight loss pearls for you.   1. It's Not a Diet. It's a Lifestyle Thinking of a diet as something you're on and suffering through only for the short term doesn't work. To shed weight and keep it off, you need to make permanent changes to the way you eat. It's OK to indulge occasionally, of course, but if you cut calories temporarily and then revert to your old way of eating, you'll gain back the weight quicker than you can say yo-yo. Use it to lose it. Research shows that one of the best predictors of long-term weight loss is how many pounds you drop in the first month. For that reason, nutritionists often suggest being stricter for the first two weeks of your new eating strategy to build momentum. Cut out added  sugar and alcohol and avoid unrefined carbs. After that, figure out how you can reincorporate them in a way that's healthy and maintainable.  2. There's a Right Way to Exercise Working out burns calories and fat and boosts your metabolism by building muscle. But those trying to lose weight are notorious for overestimating the number of calories they burn and underestimating the amount they take in. Unfortunately, your system is biologically programmed to hold on to extra pounds and that means when you start exercising, your body senses the deficit and ramps up its hunger signals. If you're not diligent, you'll eat everything you burn and then some. Use it, to lose it. Cardio gets all the exercise glory, but strength and interval training are the real heroes. They help you build lean muscle, which in turn increases your metabolism and calorie-burning ability 3. Don't Overreact to Mild Hunger Some people have a hard time losing weight because of hunger anxiety. To them, being hungry is bad--something to be avoided at all costs--so they carry snacks with them and eat when they don't need to. Others eat because they're stressed out or bored. While you never want to get to the point of being ravenous (that's when bingeing is likely to happen), a hunger pang, a craving, or the fact that it's 3:00 p.m. should not send you racing for the vending machine or obsessing about the energy bar in your purse. Ideally, you should put off eating until your stomach is  growling and it's difficult to concentrate.  Use it to lose it. When you feel the urge to eat, use the HALT method. Ask yourself, Am I really hungry? Or am I angry or anxious, lonely or bored, or tired? If you're still not certain, try the apple test. If you're truly hungry, an apple should seem delicious; if it doesn't, something else is going on. Or you can try drinking water and making yourself busy, if you are still hungry try a healthy snack.  4. Not All  Calories Are Created Equal The mechanics of weight loss are pretty simple: Take in fewer calories than you use for energy. But the kind of food you eat makes all the difference. Processed food that's high in saturated fat and refined starch or sugar can cause inflammation that disrupts the hormone signals that tell your brain you're full. The result: You eat a lot more.  Use it to lose it. Clean up your diet. Swap in whole, unprocessed foods, including vegetables, lean protein, and healthy fats that will fill you up and give you the biggest nutritional bang for your calorie buck. In a few weeks, as your brain starts receiving regular hunger and fullness signals once again, you'll notice that you feel less hungry overall and naturally start cutting back on the amount you eat.  5. Protein, Produce, and Plant-Based Fats Are Your Weight-Loss Trinity Here's why eating the three Ps regularly will help you drop pounds. Protein fills you up. You need it to build lean muscle, which keeps your metabolism humming so that you can torch more fat. People in a weight-loss program who ate double the recommended daily allowance for protein (about 110 grams for a 150-pound woman) lost 70 percent of their weight from fat, while people who ate the RDA lost only about 40 percent, one study found. Produce is packed with filling fiber. "It's very difficult to consume too many calories if you're eating a lot of vegetables. Example: Three cups of broccoli is a lot of food, yet only 93 calories. (Fruit is another story. It can be easy to overeat and can contain a lot of calories from sugar, so be sure to monitor your intake.) Plant-based fats like olive oil and those in avocados and nuts are healthy and extra satiating.  Use it to lose it. Aim to incorporate each of the three Ps into every meal and snack. People who eat protein throughout the day are able to keep weight off, according to a study in the American Journal of Clinical  Nutrition. In addition to meat, poultry and seafood, good sources are beans, lentils, eggs, tofu, and yogurt. As for fat, keep portion sizes in check by measuring out salad dressing, oil, and nut butters (shoot for one to two tablespoons). Finally, eat veggies or a little fruit at every meal. People who did that consumed 308 fewer calories but didn't feel any hungrier than when they didn't eat more produce.  7. How You Eat Is As Important As What You Eat In order for your brain to register that you're full, you need to focus on what you're eating. Sit down whenever you eat, preferably at a table. Turn off the TV or computer, put down your phone, and look at your food. Smell it. Chew slowly, and don't put another bite on your fork until you swallow. When women ate lunch this attentively, they consumed 30 percent less when snacking later than those who listened to an audiobook at lunchtime, according to  a study in the [place] Journal of Nutrition. 8. Weighing Yourself Really Works The scale provides the best evidence about whether your efforts are paying off. Seeing the numbers tick up or down or stagnate is motivation to keep going--or to rethink your approach. A 2015 study at [facility] found that daily weigh-ins helped people lose more weight, keep it off, and maintain that loss, even after two years. Use it to lose it. Step on the scale at the same time every day for the best results. If your weight shoots up several pounds from one weigh-in to the next, don't freak out. Eating a lot of salt the night before or having your period is the likely culprit. The number should return to normal in a day or two. It's a steady climb that you need to do something about. 9. Too Much Stress and Too Little Sleep Are Your Enemies When you're tired and frazzled, your body cranks up the production of cortisol, the stress hormone that can cause carb cravings. Not getting enough sleep also boosts your levels of  ghrelin, a hormone associated with hunger, while suppressing leptin, a hormone that signals fullness and satiety. People on a diet who slept only five and a half hours a night for two weeks lost 55 percent less fat and were hungrier than those who slept eight and a half hours, according to a study in the [place] [employer] Journal. Use it to lose it. Prioritize sleep, aiming for seven hours or more a night, which research shows helps lower stress. And make sure you're getting quality zzz's. If a snoring spouse or a fidgety cat wakes you up frequently throughout the night, you may end up getting the equivalent of just four hours of sleep, according to a study from [facility]. Keep pets out of the bedroom, and use a white-noise app to drown out snoring. 10. You Will Hit a plateau--And You Can Bust Through It As you slim down, your body releases much less leptin, the fullness hormone.  If you're not strength training, start right now. Building muscle can raise your metabolism to help you overcome a plateau. To keep your body challenged and burning calories, incorporate new moves and more intense intervals into your workouts or add another sweat session to your weekly routine. Alternatively, cut an extra 100 calories or so a day from your diet. Now that you've lost weight, your body simply doesn't need as much fuel.    Since food equals calories, in order to lose weight you must either eat fewer calories, exercise more to burn off calories with activity, or both. Food that is not used to fuel the body is stored as fat. A major component of losing weight is to make smarter food choices. Here's how:  1)   Limit non-nutritious foods, such as: Sugar, honey, syrups and candy Pastries, donuts, pies, cakes and cookies Soft drinks, sweetened juices and alcoholic beverages  2)  Cut down on high-fat foods by: - Choosing poultry, fish or lean red meat - Choosing low-fat cooking methods, such as  baking, broiling, steaming, grilling and boiling - Using low-fat or non-fat dairy products - Using vinaigrette, herbs, lemon or fat-free salad dressings - Avoiding fatty meats, such as bacon, sausage, franks, ribs and luncheon meats - Avoiding high-fat snacks like nuts, chips and chocolate - Avoiding fried foods - Using less butter, margarine, oil and mayonnaise - Avoiding high-fat gravies, cream sauces and cream-based soups  3) Eat a variety of  foods, including: - Fruit and vegetables that are raw, steamed or baked - Whole grains, breads, cereal, rice and pasta - Dairy products, such as low-fat or non-fat milk or yogurt, low-fat cottage cheese and low-fat cheese - Protein-rich foods like chicken, [place], fish, lean meat and legumes, or beans  4) Change your eating habits by: - Eat three balanced meals a day to help control your hunger - Watch portion sizes and eat small servings of a variety of foods - Choose low-calorie snacks - Eat only when you are hungry and stop when you are satisfied - Eat slowly and try not to perform other tasks while eating - Find other activities to distract you from food, such as walking, taking up a hobby or being involved in the community - Include regular exercise in your daily routine ( minimum of 20 min of moderate-intensity exercise at least 5 days/week)  - Find a support group, if necessary, for emotional support in your weight loss journey           Easy ways to cut 100 calories   1. Eat your eggs with hot sauce OR salsa instead of cheese.  Eggs are great for breakfast, but many people consider eggs and cheese to be BFFs. Instead of cheese--1 oz. of cheddar has 114 calories--top your eggs with hot sauce, which contains no calories and helps with satiety and metabolism. Salsa is also a great option!!  2. Top your toast, waffles or pancakes with fresh berries instead of jelly or syrup. Half a cup of berries--fresh, frozen or thawed--has about  40 calories, compared with 2 tbsp. of maple syrup or jelly, which both have about 100 calories. The berries will also give you a good punch of fiber, which helps keep you full and satisfied and wont spike blood sugar quickly like the jelly or syrup. 3. Swap the non-fat latte for black coffee with a splash of half-and-half. Contrary to its name, that non-fat latte has 130 calories and a startling 19g of carbohydrates per 16 oz. serving. Replacing that light drinkable dessert with a black coffee with a splash of half-and-half saves you more than 100 calories per 16 oz. serving. 4. Sprinkle salads with freeze-dried raspberries instead of dried cranberries. If you want a sweet addition to your nutritious salad, stay away from dried cranberries. They have a whopping 130 calories per  cup and 30g carbohydrates. Instead, sprinkle freeze-dried raspberries guilt-free and save more than 100 calories per  cup serving, adding 3g of belly-filling fiber. 5. Go for mustard in place of mayo on your sandwich. Mustard can add really nice flavor to any sandwich, and there are tons of varieties, from spicy to honey. A serving of mayo is 95 calories, versus 10 calories in a serving of mustard.  Or try an avocado mayo spread: You can find the recipe few click this link: https://www.californiaavocado.com/recipes/recipe-container/[place]ifornia-avocado-mayo 6. Choose a DIY salad dressing instead of the store-bought kind. Mix Dijon or whole grain mustard with low-fat Kefir or red wine vinegar and garlic. 7. Use hummus as a spread instead of a dip. Use hummus as a spread on a high-fiber cracker or tortilla with a sandwich and save on calories without sacrificing taste. 8. Pick just one salad accessory. Salad isnt automatically a calorie winner. Its easy to over-accessorize with toppings. Instead of topping your salad with nuts, avocado and cranberries (all three will clock in at 313 calories), just pick one. The next day,  choose a different accessory, which will also  keep your salad interesting. You dont wear all your jewelry every day, right? 9. Ditch the white pasta in favor of spaghetti squash. One cup of cooked spaghetti squash has about 40 calories, compared with traditional spaghetti, which comes with more than 200. Spaghetti squash is also nutrient-dense. Its a good source of fiber and Vitamins A and C, and it can be eaten just like you would eat pasta--with a great tomato sauce and [place] meatballs or with pesto, tofu and spinach, for example. 10. Dress up your chili, soups and stews with non-fat [place] yogurt instead of sour cream. Just a dollop of sour cream can set you back 115 calories and a whopping 12g of fat--seven of which are of the artery-clogging variety. Added bonus: [place] yogurt is packed with muscle-building protein, calcium and B Vitamins. 11. Mash cauliflower instead of mashed potatoes. One cup of traditional mashed potatoes--in all their creamy goodness--has more than 200 calories, compared to mashed cauliflower, which you can typically eat for less than 100 calories per 1 cup serving. Cauliflower is a great source of the antioxidant indole-3-carbinol (I3C), which may help reduce the risk of some cancers, like breast cancer. 12. Ditch the ice cream sundae in favor of a [place] yogurt parfait. Instead of a cup of ice cream or fro-yo for dessert, try 1 cup of nonfat Greek yogurt topped with fresh berries and a sprinkle of cacao nibs. Both toppings are packed with antioxidants, which can help reduce cellular inflammation and oxidative damage. And the comparison is a no-brainer: One cup of ice cream has about 275 calories; one cup of frozen yogurt has about 230; and a cup of Greek yogurt has just 130, plus twice the protein, so youre less likely to return to the freezer for a second helping. 13. Put olive oil in a spray container instead of using it directly from the bottle. Each tablespoon of olive  oil is 120 calories and 15g of fat. Use a mister instead of pouring it straight into the pan or onto a salad. This allows for portion control and will save you more than 100 calories. 14. When baking, substitute canned pumpkin for butter or oil. Canned pumpkin--not pumpkin pie mix--is loaded with Vitamin A, which is important for skin and eye health, as well as immunity. And the comparisons are pretty crazy:  cup of canned pumpkin has about 40 calories, compared to butter or oil, which has more than 800 calories. Yes, 800 calories. Applesauce and mashed banana can also serve as good substitutions for butter or oil, usually in a 1:1 ratio. 15. Top casseroles with high-fiber cereal instead of breadcrumbs. Breadcrumbs are typically made with white bread, while breakfast cereals contain 5-9g of fiber per serving. Not only will you save more than 150 calories per  cup serving, the swap will also keep you more full and youll get a metabolism boost from the added fiber. 16. Snack on pistachios instead of macadamia nuts. Believe it or not, you get the same amount of calories from 35 pistachios (100 calories) as you would from only five macadamia nuts. 17. Chow down on kale chips rather than potato chips. This is my favorite dont knock it till you try it swap. Kale chips are so easy to make at home, and you can spice them up with a little grated parmesan or chili powder. Plus, theyre a mere fraction of the calories of potato chips, but with the same crunch factor we crave so often. 18. Add seltzer and some  fruit slices to your cocktail instead of soda or fruit juice. One cup of soda or fruit juice can pack on as much as 140 calories. Instead, use seltzer and fruit slices. The fruit provides valuable phytochemicals, such as flavonoids and anthocyanins, which help to combat cancer and stave off the aging process.

## 2017-07-14 NOTE — Progress Notes (Signed)
Assessment and plan:  1. Essential hypertension   2. Mixed hyperlipidemia   3. Overweight (BMI 25.0-29.9)   4. Dysthymia   5. Prediabetes   6. Vitamin D deficiency     1. Verrucae  - Hyperkeratotic growth on palmar surface of left hand near the base of thumb approximately 2-3 mm diameter, present for years.  Recommended treatment with a high concentration of salicylic acid daily for 2-3 months.  2. Lab Review - Drawn 06/28/2017  CBC - White count and blood counts normal. - Premature blood cell counts normal.  Vitamin D  - Reviewed that ideal is 50 - 60 - Currently at 56.7, normalized. - Continue supplementation as prescribed.  Magnesium & Phosphorus  - Normal range.  Thyroid  - Measurements normal.  Cholesterol - Overall improved since last year.  Triglycerides at 78, improved since 102 last year HDL is 80, slightly improved 76 since last year LDL is 142, slightly improved since 149 last year.  The 10-year ASCVD risk score Denman George DC Montez Hageman., et al., 2013) is: 3.6%   Values used to calculate the score:     Age: 52 years     Sex: Female     Is Non-Hispanic African American: Yes     Diabetic: No     Tobacco smoker: No     Systolic Blood Pressure: 135 mmHg     Is BP treated: Yes     HDL Cholesterol: 80 mg/dL     Total Cholesterol: 238 mg/dL  - Advised that the only way to control this risk is through exercise and diet.  - Be more cognizant of saturated and trans fat consumption, meat consumption, cheese, and fatty foods.  - Patient may begin keto diet if desired, since patient enjoys eating fatty foods.  3. Prediabetes - A1c 2 weeks ago was 6.4, up from 6.2 one year ago. - Reviewed that limiting carbs will reduce her blood sugar. - Re-check A1c three months from 06/28/2017.  - If she wishes to purchase a glucometer, she can begin to monitor her blood sugar and make sure it is within normal  range.  4. Hypertension - Continue medications as prescribed.  - Reviewed the critical importance of reducing sodium intake to control blood pressure.  - Continue measuring BP at home and bringing log to next chronic follow up. - Goal BP remains less than 130/80.  5. Mood - Upped dose of Prozac today from 40 to 60.  - Patient knows to pay attention on this increased dose - if she feels more sweaty, she will let us know.  6. General Health Maintenance Explained to patient what BMI refers to, and what it means medically.    Told patient to think about it as a "medical risk stratification measurement" and how increasing BMI is associated with increasing risk/ or worsening state of various diseases such as hypertension, hyperlipidemia, diabetes, premature OA, depression etc.  American Heart Association guidelines for healthy diet, basically Mediterranean diet, and exercise guidelines of 30 minutes 5 days per week or more discussed in detail.  Health counseling performed.  All questions answered.  Exercise & Lifestyle - Emphasized the importance of changing diet and beginning to exercise regularly, especially when it comes to controlling her prediabetes and blood pressure.    - Patient knows to download a free app such as LoseIt or MyFinessPal.  This will ask your age, height, and weight, and you will choose "I want to lose 1  pound per week," and it will advise you on how many calories to eat per day.  - Handout on prediabetes diet and weight loss provided today.  - Advised patient to work toward exercising to lose weight and improve health.  Patient may begin with 15 minutes of activity daily.  Recommended that the patient eventually strive for at least 150 minutes of cardiovascular activity per week according to guidelines established by the [facility].  - Reviewed that increased blood flow, circulation, and vascular activity has many health benefits, such as improved mood & mentality, reduced  blood pressure, reduced extremity swelling, reduced stress, etc.  - Healthy dietary habits encouraged.  Limit sodium and carbs, and consume high amounts of lean protein in diet.   - Patient should also consume adequate amounts of water - half of body weight in oz of water per day  7. Follow-Up - Return as scheduled for chronic follow-up. - Will have A1c drawn 3 months from 06/28/2017.   Education and routine counseling performed. Handouts provided.   No orders of the defined types were placed in this encounter.   Meds ordered this encounter  Medications  . FLUoxetine 60 MG TABS    Sig: 1 po qd ( caps ok to substitute)    Dispense:  90 tablet    Refill:  3     Return for 2.5 mo- repeat A1c, Bp wt check, mood reck- INc prozac from 40 to 60mg .   Anticipatory guidance and routine counseling done re: condition, txmnt options and need for follow up. All questions of patient's were answered.   Gross side effects, risk and benefits, and alternatives of medications discussed with patient.  Patient is aware that all medications have potential side effects and we are unable to predict every sideeffect or drug-drug interaction that may occur.  Expresses verbal understanding and consents to current therapy plan and treatment regiment.  Pt was in the office today for 36+ minutes, with over 50% time spent in face to face counseling of patients various medical conditions, treatment plans of those medical conditions including medicine management and lifestyle modification, strategies to improve health and well being; and in coordination of care. SEE ABOVE FOR DETAILS  Please see AVS handed out to patient at the end of our visit for additional patient instructions/ counseling done pertaining to today's office visit.  Note: This document was prepared using Dragon voice recognition software and may include unintentional dictation errors.    This document serves as a record of services personally  performed by Thomasene Loteborah Kathreen Dileo, DO. It was created on her behalf by Peggye FothergillKatherine Galloway, a trained medical scribe. The creation of this record is based on the scribe's personal observations and the provider's statements to them.   I have reviewed the above medical documentation for accuracy and completeness and I concur.  Thomasene LotDeborah Abner Ardis 07/24/17 7:26 PM   ----------------------------------------------------------------------------------------------------------------------  Subjective:   CC:   Leah Jimenez is a 52 y.o. female who [facility] review and discussion of recent bloodwork that was done.  1. All recent blood work that we ordered was reviewed with patient today.  Patient was counseled on all abnormalities and we discussed dietary and lifestyle changes that could help those values (also medications when appropriate).  Extensive health counseling performed and all patient's concerns/ questions were addressed.   Verrucae  Patient has had growth on palmar surface of left hand near the base of thumb for years.  Vitamin D  She continues taking supplementation as prescribed.  Cholesterol Patient is interested in potentially beginning a keto diet, since she does enjoy eating fatty foods.  Hypertension Comments that her blood pressure has recently been up.  Continues taking her medications as prescribed.  Mood Patient notes that her life is "so much" right now.  She says "I'm so exhausted; I just want to go somewhere and go to sleep."  Patient also believes she has begun having hot flashes.  General Health Maintenance Patient desires to begin exercising regularly and to make a concerted effort to control her health.    Wt Readings from Last 3 Encounters:  07/14/17 165 lb 14.4 oz (75.3 kg)  08/13/16 164 lb 4.8 oz (74.5 kg)  03/12/16 164 lb 4.8 oz (74.5 kg)   BP Readings from Last 3 Encounters:  07/14/17 135/86  08/13/16  112/68  03/12/16 106/68   Pulse Readings from Last 3 Encounters:  07/14/17 67  08/13/16 78  03/12/16 69   BMI Readings from Last 3 Encounters:  07/14/17 29.39 kg/m  08/13/16 28.87 kg/m  03/12/16 28.87 kg/m     Patient Care Team    Relationship Specialty Notifications Start End  Thomasene Lot, DO PCP - General Family Medicine  10/01/15     Full medical history updated and reviewed in the office today  Patient Active Problem List   Diagnosis Date Noted  . Prediabetes 10/22/2015    Priority: High  . Essential hypertension 10/22/2015    Priority: High  . HLD: elevated LDL 10/22/2015    Priority: High  . Overweight (BMI 25.0-29.9) 10/01/2015    Priority: High  . Dysthymia 12/15/2008    Priority: High  . GERD (gastroesophageal reflux disease) 01/30/2016    Priority: Medium  . OSA on CPAP 03/06/2009    Priority: Medium  . HYPERSOMNIA 12/26/2008    Priority: Medium  . Vitamin D deficiency 10/22/2015    Priority: Low  . ALLERGIC RHINITIS 12/26/2008  . Constipation 12/13/2008  . RECTAL BLEEDING 12/13/2008    Past Medical History:  Diagnosis Date  . Constipation   . Depression     Past Surgical History:  Procedure Laterality Date  . ENDOMETRIAL ABLATION    . EYE SURGERY    . TUBAL LIGATION      Social History   Tobacco Use  . Smoking status: Former Games developer  . Smokeless tobacco: Never Used  Substance Use Topics  . Alcohol use: Yes    Alcohol/week: 1.8 oz    Types: 3 Cans of beer per week    Family Hx: Family History  Problem Relation Age of Onset  . Stroke Mother   . High Cholesterol Mother   . Hypertension Mother   . Stroke Father   . High Cholesterol Father   . Hypertension Father   . High Cholesterol Sister      Medications: Current Outpatient Medications  Medication Sig Dispense Refill  . omeprazole (PRILOSEC) 20 MG capsule Take 1 capsule (20 mg total) by mouth daily. 90 capsule 1  . triamterene-hydrochlorothiazide (MAXZIDE) 75-50 MG  tablet Take 1 tablet by mouth daily. PATIENT MUST HAVE OFFICE VISIT PRIOR TO ANY FURTHER REFILLS 30 tablet 0  . Vitamin D, Ergocalciferol, (DRISDOL) 50000 units CAPS capsule Take 1 capsule (50,000 Units total) by mouth every 7 (seven) days. Take for 8 total doses(weeks) 12 capsule 10  . FLUoxetine 60 MG TABS 1 po qd ( caps ok to substitute) 90 tablet 3   No current  facility-administered medications for this visit.     Allergies:  No Known Allergies   Review of Systems: General:   No F/C, wt loss Pulm:   No DIB, SOB, pleuritic chest pain Card:  No CP, palpitations Abd:  No n/v/d or pain Ext:  No inc edema from baseline  Objective:  Blood pressure 135/86, pulse 67, height 5\' 3"  (1.6 m), weight 165 lb 14.4 oz (75.3 kg), last menstrual period 06/22/2017, SpO2 98 %. Body mass index is 29.39 kg/m. Gen:   Well NAD, A and O *3 HEENT:    [place]/AT, EOMI,  MMM Lungs:   Normal work of breathing. CTA B/L, no Wh, rhonchi Heart:   RRR, S1, S2 WNL's, no MRG Abd:   No gross distention Exts:    warm, pink,  Brisk capillary refill, warm and well perfused.  Psych:    No HI/SI, judgement and insight good, Euthymic mood. Full Affect.   Recent Results (from the past 2160 hour(s))  VITAMIN D 25 Hydroxy (Vit-D Deficiency, Fractures)     Status: None   Collection Time: 06/28/17  8:27 AM  Result Value Ref Range   Vit D, 25-Hydroxy 56.7 30.0 - 100.0 ng/mL    Comment: Vitamin D deficiency has been defined by the Institute of Medicine and an Endocrine Society practice guideline as a level of serum 25-OH vitamin D less than 20 ng/mL (1,2). The Endocrine Society went on to further define vitamin D insufficiency as a level between 21 and 29 ng/mL (2). 1. IOM (Institute of Medicine). 2010. Dietary reference    intakes for calcium and D. [place]: The    [employer]. 2. Holick MF, Binkley [place], Bischoff-Ferrari HA, et al.    Evaluation, treatment, and prevention of vitamin D    deficiency: an  Endocrine Society clinical practice    guideline. JCEM. 2011 Jul; 96(7):1911-30.   Phosphorus     Status: None   Collection Time: 06/28/17  8:27 AM  Result Value Ref Range   Phosphorus 3.3 2.5 - 4.5 mg/dL  Magnesium     Status: None   Collection Time: 06/28/17  8:27 AM  Result Value Ref Range   Magnesium 1.7 1.6 - 2.3 mg/dL  T4, free     Status: None   Collection Time: 06/28/17  8:27 AM  Result Value Ref Range   Free T4 1.29 0.82 - 1.77 ng/dL  TSH     Status: None   Collection Time: 06/28/17  8:27 AM  Result Value Ref Range   TSH 2.540 0.450 - 4.500 uIU/mL  Lipid panel     Status: Abnormal   Collection Time: 06/28/17  8:27 AM  Result Value Ref Range   Cholesterol, Total 238 (H) 100 - 199 mg/dL   Triglycerides 78 0 - 149 mg/dL   HDL 80 >84 mg/dL   VLDL Cholesterol Cal 16 5 - 40 mg/dL   LDL Calculated 696 (H) 0 - 99 mg/dL   Chol/HDL Ratio 3.0 0.0 - 4.4 ratio    Comment:                                   T. Chol/HDL Ratio  Men  Women                               1/2 Avg.Risk  3.4    3.3                                   Avg.Risk  5.0    4.4                                2X Avg.Risk  9.6    7.1                                3X Avg.Risk 23.4   11.0   Hemoglobin A1c     Status: Abnormal   Collection Time: 06/28/17  8:27 AM  Result Value Ref Range   Hgb A1c MFr Bld 6.4 (H) 4.8 - 5.6 %    Comment:          Prediabetes: 5.7 - 6.4          Diabetes: >6.4          Glycemic control for adults with diabetes: <7.0    Est. average glucose Bld gHb Est-mCnc 137 mg/dL  Comprehensive metabolic panel     Status: Abnormal   Collection Time: 06/28/17  8:27 AM  Result Value Ref Range   Glucose 118 (H) 65 - 99 mg/dL   BUN 14 6 - 24 mg/dL   Creatinine, Ser 8.65 0.57 - 1.00 mg/dL   GFR calc non Af Amer 68 >59 mL/min/1.73   GFR calc Af Amer 79 >59 mL/min/1.73   BUN/Creatinine Ratio 15 9 - 23   Sodium 145 (H) 134 - 144 mmol/L   Potassium 3.7 3.5  - 5.2 mmol/L   Chloride 100 96 - 106 mmol/L   CO2 29 20 - 29 mmol/L   Calcium 9.6 8.7 - 10.2 mg/dL   Total Protein 6.7 6.0 - 8.5 g/dL   Albumin 4.2 3.5 - 5.5 g/dL   Globulin, Total 2.5 1.5 - 4.5 g/dL   Albumin/Globulin Ratio 1.7 1.2 - 2.2   Bilirubin Total 0.3 0.0 - 1.2 mg/dL   Alkaline Phosphatase 115 39 - 117 IU/L   AST 25 0 - 40 IU/L   ALT 28 0 - 32 IU/L  CBC with Differential/Platelet     Status: None   Collection Time: 06/28/17  8:27 AM  Result Value Ref Range   WBC 4.6 3.4 - 10.8 x10E3/uL   RBC 4.67 3.77 - 5.28 x10E6/uL   Hemoglobin 12.7 11.1 - 15.9 g/dL   Hematocrit 78.4 69.6 - 46.6 %   MCV 85 79 - 97 fL   MCH 27.2 26.6 - 33.0 pg   MCHC 32.0 31.5 - 35.7 g/dL   RDW 29.5 28.4 - 13.2 %   Platelets 342 150 - 379 x10E3/uL   Neutrophils 59 Not Estab. %   Lymphs 31 Not Estab. %   Monocytes 8 Not Estab. %   Eos 2 Not Estab. %   Basos 0 Not Estab. %   Neutrophils Absolute 2.7 1.4 - 7.0 x10E3/uL   Lymphocytes Absolute 1.4 0.7 - 3.1 x10E3/uL   Monocytes Absolute 0.4 0.1 - 0.9 x10E3/uL   EOS (ABSOLUTE) 0.1 0.0 - 0.4 x10E3/uL  Basophils Absolute 0.0 0.0 - 0.2 x10E3/uL   Immature Granulocytes 0 Not Estab. %   Immature Grans (Abs) 0.0 0.0 - 0.1 x10E3/uL

## 2017-08-23 ENCOUNTER — Other Ambulatory Visit: Payer: Self-pay | Admitting: Family Medicine

## 2017-08-23 DIAGNOSIS — I10 Essential (primary) hypertension: Secondary | ICD-10-CM

## 2017-09-20 ENCOUNTER — Ambulatory Visit: Payer: Managed Care, Other (non HMO) | Admitting: Family Medicine

## 2017-11-19 ENCOUNTER — Other Ambulatory Visit: Payer: Self-pay | Admitting: Family Medicine

## 2017-11-21 ENCOUNTER — Encounter: Payer: Self-pay | Admitting: Family Medicine

## 2017-12-28 ENCOUNTER — Encounter: Payer: Self-pay | Admitting: Family Medicine

## 2017-12-28 ENCOUNTER — Ambulatory Visit (INDEPENDENT_AMBULATORY_CARE_PROVIDER_SITE_OTHER): Payer: Managed Care, Other (non HMO) | Admitting: Family Medicine

## 2017-12-28 VITALS — BP 122/79 | HR 77 | Ht 63.0 in | Wt 163.2 lb

## 2017-12-28 DIAGNOSIS — I10 Essential (primary) hypertension: Secondary | ICD-10-CM

## 2017-12-28 DIAGNOSIS — G4719 Other hypersomnia: Secondary | ICD-10-CM

## 2017-12-28 DIAGNOSIS — R7303 Prediabetes: Secondary | ICD-10-CM | POA: Diagnosis not present

## 2017-12-28 DIAGNOSIS — F341 Dysthymic disorder: Secondary | ICD-10-CM | POA: Diagnosis not present

## 2017-12-28 DIAGNOSIS — E663 Overweight: Secondary | ICD-10-CM

## 2017-12-28 DIAGNOSIS — Z1211 Encounter for screening for malignant neoplasm of colon: Secondary | ICD-10-CM

## 2017-12-28 DIAGNOSIS — R0683 Snoring: Secondary | ICD-10-CM

## 2017-12-28 NOTE — Patient Instructions (Addendum)
-Please call insurance about colonoscopy versus cologuard cost  -    Sleep Apnea Sleep apnea is a condition in which breathing pauses or becomes shallow during sleep. Episodes of sleep apnea usually last 10 seconds or longer, and they may occur as many as 20 times an hour. Sleep apnea disrupts your sleep and keeps your body from getting the rest that it needs. This condition can increase your risk of certain health problems, including:  Heart attack.  Stroke.  Obesity.  Diabetes.  Heart failure.  Irregular heartbeat.  There are three kinds of sleep apnea:  Obstructive sleep apnea. This kind is caused by a blocked or collapsed airway.  Central sleep apnea. This kind happens when the part of the brain that controls breathing does not send the correct signals to the muscles that control breathing.  Mixed sleep apnea. This is a combination of obstructive and central sleep apnea.  What are the causes? The most common cause of this condition is a collapsed or blocked airway. An airway can collapse or become blocked if:  Your throat muscles are abnormally relaxed.  Your tongue and tonsils are larger than normal.  You are overweight.  Your airway is smaller than normal.  What increases the risk? This condition is more likely to develop in people who:  Are overweight.  Smoke.  Have a smaller than normal airway.  Are elderly.  Are female.  Drink alcohol.  Take sedatives or tranquilizers.  Have a family history of sleep apnea.  What are the signs or symptoms? Symptoms of this condition include:  Trouble staying asleep.  Daytime sleepiness and tiredness.  Irritability.  Loud snoring.  Morning headaches.  Trouble concentrating.  Forgetfulness.  Decreased interest in sex.  Unexplained sleepiness.  Mood swings.  Personality changes.  Feelings of depression.  Waking up often during the night to urinate.  Dry mouth.  Sore throat.  How is this  diagnosed? This condition may be diagnosed with:  A medical history.  A physical exam.  A series of tests that are done while you are sleeping (sleep study). These tests are usually done in a sleep lab, but they may also be done at home.  How is this treated? Treatment for this condition aims to restore normal breathing and to ease symptoms during sleep. It may involve managing health issues that can affect breathing, such as high blood pressure or obesity. Treatment may include:  Sleeping on your side.  Using a decongestant if you have nasal congestion.  Avoiding the use of depressants, including alcohol, sedatives, and narcotics.  Losing weight if you are overweight.  Making changes to your diet.  Quitting smoking.  Using a device to open your airway while you sleep, such as: ? An oral appliance. This is a custom-made mouthpiece that shifts your lower jaw forward. ? A continuous positive airway pressure (CPAP) device. This device delivers oxygen to your airway through a mask. ? A nasal expiratory positive airway pressure (EPAP) device. This device has valves that you put into each nostril. ? A bi-level positive airway pressure (BPAP) device. This device delivers oxygen to your airway through a mask.  Surgery if other treatments do not work. During surgery, excess tissue is removed to create a wider airway.  It is important to get treatment for sleep apnea. Without treatment, this condition can lead to:  High blood pressure.  Coronary artery disease.  (Men) An inability to achieve or maintain an erection (impotence).  Reduced thinking abilities.  Follow these instructions at home:  Make any lifestyle changes that your health care provider recommends.  Eat a healthy, well-balanced diet.  Take over-the-counter and prescription medicines only as told by your health care provider.  Avoid using depressants, including alcohol, sedatives, and narcotics.  Take steps to  lose weight if you are overweight.  If you were given a device to open your airway while you sleep, use it only as told by your health care provider.  Do not use any tobacco products, such as cigarettes, chewing tobacco, and e-cigarettes. If you need help quitting, ask your health care provider.  Keep all follow-up visits as told by your health care provider. This is important. Contact a health care provider if:  The device that you received to open your airway during sleep is uncomfortable or does not seem to be working.  Your symptoms do not improve.  Your symptoms get worse. Get help right away if:  You develop chest pain.  You develop shortness of breath.  You develop discomfort in your back, arms, or stomach.  You have trouble speaking.  You have weakness on one side of your body.  You have drooping in your face. These symptoms may represent a serious problem that is an emergency. Do not wait to see if the symptoms will go away. Get medical help right away. Call your local emergency services (911 in the [place] Do not drive yourself to the hospital. This information is not intended to replace advice given to you by your health care provider. Make sure you discuss any questions you have with your health care provider. Document Released: 03/12/2002 Document Revised: 11/16/2015 Document Reviewed: 12/30/2014 Elsevier Interactive Patient Education  [employer].

## 2017-12-28 NOTE — Progress Notes (Signed)
Impression and Recommendations:    1. Essential hypertension   2. Prediabetes   3. Dysthymia   4. Overweight (BMI 25.0-29.9)   5. Screening for colon cancer   6. Loud snoring   7. Excessive daytime sleepiness     HTN -Bp stable -Encouraged pt to continue taking ambulatory bp checks -Encouraged pt to continue decreasing stress  -Discussed importance of exercise and healthy diet on controlling bp  Dysthmia - Stable. Continue medications.  Patient denies need for med dose adjustment -Encouraged pt to find ways to reduce stress in her life -Discussed ways to improve stress without medication, including exercise and self care -Encouraged pt to consider finding a new job in order to decrease financial and work stress  Weight -Discussed the importance of regular exercise -Encouraged pt to find time to walk or increase activity level  Snoring/somnolence -Epworth scale socre of 12 -Referred pt for a sleep study -Instructed pt to complete epworth scale during OV  -Discussed different kinds of sleep apnea physicians, including neurologists, cardiologists, and pulmonologists -Discussed pt obtaining another sleep study as her last one was in 2010 -Provided handouts about the epworth sleep scale -Discussed the STOP BANG and Epworth sleep scale    Colonoscopy -Discussed the medical guidelines for colonoscopy -Encouraged pt to consider having a colonoscopy due to age over 66 discussed the difference between cologuard and colonoscopy -encouraged t to contact her insurance to learn about cost and coverage -Discussed possibility of polyp removal during colonoscopy and pathology if necessary  Follow up -return in 4 months for rechecking bp, sleep, mood, and weight   Education and routine counseling performed. Handouts provided.  Orders Placed This Encounter  Procedures  . Cologuard  . Ambulatory referral to Sleep Studies    Gross side effects, risk and benefits, and  alternatives of medications and treatment plan in general discussed with patient.  Patient is aware that all medications have potential side effects and we are unable to predict every side effect or drug-drug interaction that may occur.   Patient will call with any questions prior to using medication if they have concerns.  Expresses verbal understanding and consents to current therapy and treatment regimen.  No barriers to understanding were identified.  Red flag symptoms and signs discussed in detail.  Patient expressed understanding regarding what to do in case of emergency\urgent symptoms  Please see AVS handed out to patient at the end of our visit for further patient instructions/ counseling done pertaining to today's office visit.   Return for 61mo or so; BP, Sleep, mood etc..     Note:  This document was prepared using Dragon voice recognition software and may include unintentional dictation errors.   This document serves as a record of services personally performed by Thomasene Lot, MD. It was created on her behalf by Alphonse Guild, a trained medical scribe. The creation of this record is based on the scribe's personal observations and the provider's statements to them.    I have reviewed the above medical documentation for accuracy and completeness and I concur.  Thomasene Lot 12/28/17 4:36 PM   --------------------------------------------------------------------------------------------------------------------------------------------------------------------------------------------------------------------------------------------    Subjective:    CC:  Chief Complaint  Patient presents with  . Follow-up    HPI: Leah Jimenez is a 52 y.o. female who presents to [facility] today for follow-up of mood.    1. HTN HPI: -  Her blood pressure has been controlled at home.  Pt is checking it at home.  - Patient reports good compliance with blood  pressure medications - Denies medication S-E   - Smoking Status noted   - She denies new onset of: chest pain, exercise intolerance, shortness of breath, dizziness, visual changes, headache, lower extremity swelling or claudication.   Last 3 blood pressure readings in our office are as follows: BP Readings from Last 3 Encounters:  12/28/17 122/79  07/14/17 135/86  08/13/16 112/68    Filed Weights   12/28/17 0948  Weight: 163 lb 3.2 oz (74 kg)    Snoring/somnolence  -Pt states she has not been contacted by the sleep clinic  -Pt asked about the different kinds of sleep studies -States her husband has stated that she snores - Epworth Sleepiness scale:   -Sitting and reading- #2 moderate chance of dozing off  -Watching TV- #2 moderate chance of dozing off  -Sitting, inactive in a public place- #1 slight chance  -As a passenger in a car for an hour without a break- #3 high chance of dozing  -Lying down to rest in the afternoon when circumstances permit-#3 high chance  -Sitting and talking to someone-  0 chance  -Sitting quietly after lunch without alcohol- #1 slight chance  -In a car, while stopped for a few minutes in traffic-  0 chance  -------------------------------------------------------------------------------------------------  Total -12    Colonoscopy -Pt is worried about cost aossciated with coloniscoy[ -states her husband doesn't believe it is will be covered by insurance -Pt is open to cologuard as an option but doesn't understand why a colonoscopy is necesary  Mood -Pt states she's been doing well  -Says she her job has been very busy without much increase in pay -States her job has cut overtime pay but still expects the same work -Says she and her husband are going [place] to celebrate their anniversary -Pt states her medication has really helped her control her mood -States her only issue is money  Weight -Pt states she is not exercising much, but has been  out throwing the ball with her dog   Depression screen [facility] 2/9 12/28/2017 07/14/2017 06/02/2017  Decreased Interest 0 0 0  Down, Depressed, Hopeless 0 0 0  PHQ - 2 Score 0 0 0  Altered sleeping 0 0 0  Tired, decreased energy 0 2 0  Change in appetite 0 0 0  Feeling bad or failure about yourself  0 0 0  Trouble concentrating 0 0 0  Moving slowly or fidgety/restless 0 0 0  Suicidal thoughts 0 0 0  PHQ-9 Score 0 2 0  Difficult doing work/chores Not difficult at all Not difficult at all Not difficult at all     Wt Readings from Last 3 Encounters:  12/28/17 163 lb 3.2 oz (74 kg)  07/14/17 165 lb 14.4 oz (75.3 kg)  08/13/16 164 lb 4.8 oz (74.5 kg)   BP Readings from Last 3 Encounters:  12/28/17 122/79  07/14/17 135/86  08/13/16 112/68   Pulse Readings from Last 3 Encounters:  12/28/17 77  07/14/17 67  08/13/16 78   BMI Readings from Last 3 Encounters:  12/28/17 28.91 kg/m  07/14/17 29.39 kg/m  08/13/16 28.87 kg/m       Patient Care Team    Relationship Specialty Notifications Start End  Thomasene Lot, DO PCP - General Family Medicine  10/01/15      Patient Active Problem List   Diagnosis Date Noted  . Prediabetes 10/22/2015    Priority:  High  . Essential hypertension 10/22/2015    Priority: High  . HLD: elevated LDL 10/22/2015    Priority: High  . Overweight (BMI 25.0-29.9) 10/01/2015    Priority: High  . Dysthymia 12/15/2008    Priority: High  . GERD (gastroesophageal reflux disease) 01/30/2016    Priority: Medium  . OSA on CPAP 03/06/2009    Priority: Medium  . HYPERSOMNIA 12/26/2008    Priority: Medium  . Vitamin D deficiency 10/22/2015    Priority: Low  . Loud snoring 12/28/2017  . Excessive daytime sleepiness 12/28/2017  . ALLERGIC RHINITIS 12/26/2008  . Constipation 12/13/2008  . RECTAL BLEEDING 12/13/2008    Past Medical history, Surgical history, Family history, Social history, Allergies and Medications have been entered into the medical  record, reviewed and changed as needed.    Current Meds  Medication Sig  . FLUoxetine 60 MG TABS 1 po qd ( caps ok to substitute)  . omeprazole (PRILOSEC) 20 MG capsule Take 1 capsule (20 mg total) by mouth daily.  Marland Kitchen omeprazole (PRILOSEC) 20 MG capsule Take 1 capsule (20 mg total) by mouth 2 (two) times daily before a meal. Patient needs office visit for further refills  . triamterene-hydrochlorothiazide (MAXZIDE) 75-50 MG tablet Take 1 tablet by mouth daily.  . Vitamin D, Ergocalciferol, (DRISDOL) 50000 units CAPS capsule Take 1 capsule (50,000 Units total) by mouth every 7 (seven) days. Take for 8 total doses(weeks)    Allergies:  No Known Allergies   Review of Systems: Review of Systems: General:   No F/C, wt loss Pulm:   No DIB, SOB, pleuritic chest pain Card:  No CP, palpitations Abd:  No n/v/d or pain Ext:  No inc edema from baseline Psych: no SI/ HI    Objective:   Blood pressure 122/79, pulse 77, height 5\' 3"  (1.6 m), weight 163 lb 3.2 oz (74 kg), SpO2 98 %. Body mass index is 28.91 kg/m. General:  Well Developed, well nourished, appropriate for stated age.  Neuro:  Alert and oriented,  extra-ocular muscles intact  HEENT:  Normocephalic, atraumatic, neck supple, no carotid bruits appreciated  Skin:  no gross rash, warm, pink. Cardiac:  RRR, S1 S2 Respiratory:  ECTA B/L and A/P, Not using accessory muscles, speaking in full sentences- unlabored. Vascular:  Ext warm, no cyanosis apprec.; cap RF less 2 sec. Psych:  No HI/SI, judgement and insight good, Euthymic mood. Full Affect.

## 2018-01-25 ENCOUNTER — Other Ambulatory Visit: Payer: Self-pay | Admitting: Family Medicine

## 2018-02-22 ENCOUNTER — Telehealth: Payer: Self-pay | Admitting: Family Medicine

## 2018-03-14 ENCOUNTER — Other Ambulatory Visit: Payer: Self-pay | Admitting: Cardiovascular Disease

## 2018-03-14 ENCOUNTER — Telehealth: Payer: Self-pay | Admitting: *Deleted

## 2018-03-14 DIAGNOSIS — G4719 Other hypersomnia: Secondary | ICD-10-CM

## 2018-03-14 DIAGNOSIS — I10 Essential (primary) hypertension: Secondary | ICD-10-CM

## 2018-03-14 DIAGNOSIS — R0683 Snoring: Secondary | ICD-10-CM

## 2018-03-14 NOTE — Telephone Encounter (Signed)
PA submitted to [employer] via web portal for sleep study. 

## 2018-03-14 NOTE — Telephone Encounter (Signed)
Left message for patient to return a call to discuss sleep study. 

## 2018-03-14 NOTE — Telephone Encounter (Signed)
-----   Message from Sampson GoonShawnee I Trigloff sent at 03/14/2018  9:52 AM EST ----- Regarding: RE: appt No  ----- Message ----- From: Gaynelle CageWaddell, Shalaine Payson M, CMA Sent: 03/14/2018   9:37 AM EST To: Sampson GoonShawnee I Trigloff Subject: RE: appt                                       By reading the note from the MD it looks like a sleep study. Is there a order in the system? ----- Message ----- From: Sharin Monsrigloff, Shawnee I Sent: 03/14/2018   8:16 AM EST To: Gaynelle CageWanda M Caylin Nass, CMA Subject: appt                                             Good Morning Burna MortimerWanda  I am wondering if this patient needs a sleep clinic appointment or sleep study.  If it is a sleep clinic appointment can you let me know when he has sleep clinics?  Thanks ONEOKShawnee

## 2018-03-15 NOTE — Telephone Encounter (Signed)
Left message to return a call to discuss HST details.

## 2018-03-17 NOTE — Telephone Encounter (Signed)
Left message to return a call. This is attempt # 3. No further attempts will be made. Referring PCP will be notified patient not responding to calls.

## 2018-03-21 ENCOUNTER — Telehealth: Payer: Self-pay | Admitting: *Deleted

## 2018-03-21 NOTE — Telephone Encounter (Signed)
Notified Melissa @ Dr Synthia Innocentpalski's office patient has HST scheduled for 03/24/18. I have left 3 messages for the patient to return a call to get appointment details. She has not returned my call. I asked Melissa to attempt to call the patient to see if she will respond to her.

## 2018-03-24 ENCOUNTER — Ambulatory Visit (HOSPITAL_BASED_OUTPATIENT_CLINIC_OR_DEPARTMENT_OTHER): Payer: Managed Care, Other (non HMO) | Attending: Cardiovascular Disease

## 2018-04-04 ENCOUNTER — Other Ambulatory Visit: Payer: Self-pay | Admitting: Family Medicine

## 2018-05-05 ENCOUNTER — Ambulatory Visit: Payer: Managed Care, Other (non HMO) | Admitting: Family Medicine

## 2018-06-26 ENCOUNTER — Other Ambulatory Visit: Payer: Self-pay | Admitting: Family Medicine

## 2018-08-15 ENCOUNTER — Other Ambulatory Visit: Payer: Self-pay | Admitting: Family Medicine

## 2018-09-28 ENCOUNTER — Ambulatory Visit (INDEPENDENT_AMBULATORY_CARE_PROVIDER_SITE_OTHER): Payer: Self-pay | Admitting: Family Medicine

## 2018-09-28 ENCOUNTER — Other Ambulatory Visit: Payer: Self-pay

## 2018-09-28 ENCOUNTER — Encounter: Payer: Self-pay | Admitting: Family Medicine

## 2018-09-28 VITALS — Ht 63.0 in

## 2018-09-28 DIAGNOSIS — F410 Panic disorder [episodic paroxysmal anxiety] without agoraphobia: Secondary | ICD-10-CM

## 2018-09-28 DIAGNOSIS — F430 Acute stress reaction: Secondary | ICD-10-CM

## 2018-09-28 DIAGNOSIS — F4323 Adjustment disorder with mixed anxiety and depressed mood: Secondary | ICD-10-CM

## 2018-09-28 DIAGNOSIS — F39 Unspecified mood [affective] disorder: Secondary | ICD-10-CM

## 2018-09-28 DIAGNOSIS — Z560 Unemployment, unspecified: Secondary | ICD-10-CM

## 2018-09-28 MED ORDER — FLUOXETINE HCL 40 MG PO CAPS: 80.0000 mg | ORAL_CAPSULE | Freq: Every day | ORAL | 1 refills | Status: DC

## 2018-09-28 MED ORDER — BUSPIRONE HCL 10 MG PO TABS: 5.0000 mg | ORAL_TABLET | Freq: Three times a day (TID) | ORAL | 5 refills | Status: DC

## 2018-09-28 NOTE — Progress Notes (Signed)
Virtual / live video office visit note for Marsh & McLennanDeborah Zehra Jimenez, D.O- [facility] Phys[facility]   I connected with current patient today and beyond visually recognizing the correct individual, I verified that I am speaking with the correct person using two identifiers.  . Location of the patient: Home . Location of the provider: Office Only the patient (+/- their family members at pt's discretion) and myself were participating in the encounter    - This visit type was conducted due to national recommendations for restrictions regarding the COVID-19 Pandemic (e.g. social distancing) in an effort to limit this patient's exposure and mitigate transmission in our community.  This format is felt to be most appropriate for this patient at this time.   - The patient did have access to video technology today but unfortunately we had to convert to audio only form due to technical difficulties. - No physical exam could be performed with this format, beyond that communicated to us by the patient/ family members as noted.   - Additionally my office staff/ schedulers discussed with the patient that there may be a monetary charge related to this service, depending on patient's medical insurance.   The patient expressed understanding, and agreed to proceed.      History of Present Illness:  Last seen 12/28/17 and told  "Return for 65mo or so; BP, Sleep, mood"--->      " Patient is happily married and has 3 children, a set of twins at 54 years old and one older daughter at 5920 who is a sophomore [employer].   Daughter 54 yo.   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  She's had a difficult time and has been emotionally challenging since then.  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   But her getting laid off in 2014 has taken an emotional toll on her.  Been on fluoxetine  since 2006 for perimenopausal symptoms but now feels she needs it increased for emotional lability.  Tearful at times and her "nerves" are not good anymore. She can become very anxious driving in the car to the point where her husband tells her she needs to drink at the end of a three-hour ride because she can get herself so worked up. "   Husband said once "if a company likes you, they will find a place for you." Husband has been w his co 31 yrs.    - pt lost her job with covid and all- she feels badly about this   Husband still working [employer]ing manage[employer]c.   Morrie SheldonAshley working from home.  Sons are working from home.  Mood:  Having a tough time- she is embarrassed to admit how she feels.  Eventually she admitted to having SI- but has never thought of how she would do it and states she would never do anything like that and leave her children/family.  But, she states her mind is just "been in a dark place lately" and she is feeling very low and rejected and like she is not contributing to the family anymore since the loss of her job.  She also has been struggling and she is panicking and worrying excessively about her African-American teenage son.  She is having nights where her heart races, she feels her chest is tight and she cannot breathe etc.  She states that she just focuses on happier thoughts and does some deep breathing she can pull herself out of it.   [facility] 2/9 09/28/2018 12/28/2017 07/14/2017 06/02/2017 06/02/2017  Decreased Interest 0 0 0 0 0  Down, Depressed, Hopeless 2 0 0 0 0  PHQ - 2 Score 2 0 0 0 0  Altered sleeping 2 0 0 0 -  Tired, decreased energy 1 0 2 0 -  Change in appetite 2 0 0 0 -  Feeling bad or failure about yourself  2 0 0 0 -  Trouble concentrating 1 0 0 0 -  Moving slowly or fidgety/restless 1 0 0 0 -  Suicidal thoughts 1 0 0 0 -  PHQ-9 Score 12 0 2 0 -  Difficult doing work/chores Very difficult Not difficult at all Not difficult at all Not difficult at all -    GAD 7  : Generalized Anxiety Score 09/28/2018  Nervous, Anxious, on Edge 1  Control/stop worrying 1  Worry too much - different things 1  Trouble relaxing 1  Restless 1  Easily annoyed or irritable 0  Afraid - awful might happen 0  Total GAD 7 Score 5  Anxiety Difficulty Very difficult         Impression and Recommendations:    1. Adjustment disorder with mixed anxiety and depressed mood   2. Mood disorder (HCC)   3. Panic attack as reaction to stress    -Long discussion with patient.  She has thoughts but no plan.  She understands and made a verbal contract with me today that she would not harm herself or hurt her self and if she felt that way, she agrees to dial 911 or call our office immediately. -She has been on fluoxetine 60 mg for some time now.  She has been on the medicine since 2006 due to perimenopausal symptoms.  We will increase dose from 60 to 80. -Also urgent referral for counseling to help her through this acute stressful time in her life. -Started BuSpar.  Long discussion with patient that this takes some time but eventually will help with her anxiety.  She declines Xanax at this time. -Patient agrees to use the calm app or headspace app twice daily for 15 minutes and do meditation on anxiety or depression what ever she is most feeling that day. -She agrees to exercise 30 minutes of walking every day or more -She also agrees to keep a great fullness journal and write every day think she is grateful for -We also discussed meditation and other apps that can help her with her feelings.  Also we discussed some books that she can buy to help herself as well. -We will do close follow-up and see patient in 7 to 10 days she is to call us sooner if she has any increase in her symptoms or they worsen in any way.   - As part of my medical decision making, I reviewed the following data within the electronic MEDICAL RECORD NUMBER History obtained from pt /family, CMA notes reviewed and  incorporated if applicable, Labs reviewed, Radiograph/ tests reviewed if applicable and OV notes from prior OV's with me, as well as other specialists she/he has seen since seeing me last, were all reviewed and used in my medical decision making process today.   - Additionally, discussion had with patient regarding txmnt plan, their biases about that plan etc were used in my medical decision making today.   - The patient  agreed with the plan and demonstrated an understanding of the instructions.   No barriers to understanding were identified.   - Red flag symptoms and signs discussed in detail.  Patient expressed understanding regarding what to do in case of emergency\ urgent symptoms.  The patient was advised to call back or seek an in-person evaluation if the symptoms worsen or if the condition fails to improve as anticipated.   Return for 2) 7-10d .    Orders Placed This Encounter  Procedures  . Ambulatory referral to Psychology    Meds ordered this encounter  Medications  . busPIRone (BUSPAR) 10 MG tablet    Sig: Take 0.5 tablets (5 mg total) by mouth 3 (three) times daily. For 4wks, then 1 tid    Dispense:  90 tablet    Refill:  5  . FLUoxetine (PROZAC) 40 MG capsule    Sig: Take 2 capsules (80 mg total) by mouth daily.    Dispense:  180 capsule    Refill:  1    Medications Discontinued During This Encounter  Medication Reason  . omeprazole (PRILOSEC) 20 MG capsule Duplicate  . FLUoxetine (PROZAC) 20 MG capsule      I provided 42 minutes of non-face-to-face time during this encounter,with over 50% of the time in direct counseling on patients medical conditions/ medical concerns.  Additional time was spent with charting and coordination of care after the actual visit commenced.   Note:  This note was prepared with assistance of Dragon voice recognition software. Occasional wrong-word or sound-a-like substitutions may have occurred due to the inherent limitations of voice  recognition software.  Mellody Dance, DO 09/28/18    Patient Care Team    Relationship Specialty Notifications Start End  Mellody Dance, DO PCP - General Family Medicine  10/01/15     -Vitals obtained; medications/ allergies reconciled;  personal medical, social, Sx etc.histories were updated by CMA, reviewed by me and are reflected in chart  Patient Active Problem List   Diagnosis Date Noted  . Prediabetes 10/22/2015    Priority: High  . Essential hypertension 10/22/2015    Priority: High  . HLD: elevated LDL 10/22/2015    Priority: High  . Overweight (BMI 25.0-29.9) 10/01/2015    Priority: High  . Dysthymia 12/15/2008    Priority: High  . GERD (gastroesophageal reflux disease) 01/30/2016    Priority: Medium  . OSA on CPAP 03/06/2009    Priority: Medium  . HYPERSOMNIA 12/26/2008    Priority: Medium  . Vitamin D deficiency 10/22/2015    Priority: Low  . Loud snoring 12/28/2017  . Excessive daytime sleepiness 12/28/2017  . ALLERGIC RHINITIS 12/26/2008  . Constipation 12/13/2008  . RECTAL BLEEDING 12/13/2008     Current Meds  Medication Sig  . FLUoxetine (PROZAC) 40 MG capsule Take 2 capsules (80 mg total) by mouth daily.  Marland Kitchen omeprazole (PRILOSEC) 20 MG capsule Take 1 capsule (20 mg total) by mouth daily.  Marland Kitchen triamterene-hydrochlorothiazide (MAXZIDE) 75-50 MG tablet Take 1 tablet by mouth daily.  . Vitamin D, Ergocalciferol, (DRISDOL) 50000 units CAPS capsule Take 1 capsule (50,000 Units total) by mouth every 7 (seven) days. Take for 8 total doses(weeks)  . [DISCONTINUED] FLUoxetine (PROZAC) 20 MG capsule Take 3 capsules (60 mg total) by mouth daily. TAKE 3 CAPSULES (60 MG TOTAL) BY MOUTH DAILY  Needs office visit for refills  . [DISCONTINUED] omeprazole (PRILOSEC) 20 MG capsule TAKE 1 CAPSULE BY MOUTH 2 TIMES DAILY BEFORE A MEAL. PATIENT NEEDS OFFICE VISIT  FOR FURTHER REFILLS     No Known Allergies   ROS:  See above HPI for pertinent positives and negatives    Objective:   Height 5\' 3"  (1.6 m).  (if some vitals are omitted, this means that patient was UNABLE to obtain them even though they were asked to get them prior to OV today.  They were asked to call us at their earliest convenience with these once obtained.)  General: A & O * 3; visually in no acute distress; in usual state of health.  Skin: Visible skin appears normal and pt's usual skin color HEENT:  EOMI, head is normocephalic and atraumatic.  Sclera are anicteric. Neck has a good range of motion.  Lips are noncyanotic Chest: normal chest excursion and movement Respiratory: speaking in full sentences, no conversational dyspnea; no use of accessory muscles Psych: insight good, mood- appears full

## 2018-10-09 ENCOUNTER — Other Ambulatory Visit: Payer: Self-pay | Admitting: Family Medicine

## 2018-10-09 DIAGNOSIS — E559 Vitamin D deficiency, unspecified: Secondary | ICD-10-CM

## 2018-10-18 ENCOUNTER — Ambulatory Visit: Payer: 59 | Admitting: Licensed Clinical Social Worker

## 2018-10-19 ENCOUNTER — Other Ambulatory Visit: Payer: Self-pay | Admitting: Adult Health

## 2018-10-19 ENCOUNTER — Ambulatory Visit (INDEPENDENT_AMBULATORY_CARE_PROVIDER_SITE_OTHER): Payer: 59 | Admitting: Licensed Clinical Social Worker

## 2018-10-19 DIAGNOSIS — F331 Major depressive disorder, recurrent, moderate: Secondary | ICD-10-CM

## 2018-10-19 DIAGNOSIS — E663 Overweight: Secondary | ICD-10-CM

## 2018-10-19 DIAGNOSIS — I10 Essential (primary) hypertension: Secondary | ICD-10-CM

## 2018-10-19 DIAGNOSIS — E559 Vitamin D deficiency, unspecified: Secondary | ICD-10-CM

## 2018-10-19 DIAGNOSIS — E782 Mixed hyperlipidemia: Secondary | ICD-10-CM

## 2018-10-19 DIAGNOSIS — R7303 Prediabetes: Secondary | ICD-10-CM

## 2018-10-19 DIAGNOSIS — F341 Dysthymic disorder: Secondary | ICD-10-CM

## 2018-11-16 ENCOUNTER — Ambulatory Visit (INDEPENDENT_AMBULATORY_CARE_PROVIDER_SITE_OTHER): Payer: 59 | Admitting: Licensed Clinical Social Worker

## 2018-11-16 DIAGNOSIS — F3341 Major depressive disorder, recurrent, in partial remission: Secondary | ICD-10-CM

## 2018-11-18 ENCOUNTER — Encounter: Payer: Self-pay | Admitting: Family Medicine

## 2018-11-18 DIAGNOSIS — F4323 Adjustment disorder with mixed anxiety and depressed mood: Secondary | ICD-10-CM | POA: Insufficient documentation

## 2018-11-18 DIAGNOSIS — Z560 Unemployment, unspecified: Secondary | ICD-10-CM | POA: Insufficient documentation

## 2018-11-18 DIAGNOSIS — F39 Unspecified mood [affective] disorder: Secondary | ICD-10-CM | POA: Insufficient documentation

## 2018-11-18 DIAGNOSIS — F410 Panic disorder [episodic paroxysmal anxiety] without agoraphobia: Secondary | ICD-10-CM | POA: Insufficient documentation

## 2018-12-07 ENCOUNTER — Ambulatory Visit: Payer: Self-pay | Admitting: Licensed Clinical Social Worker

## 2019-02-07 ENCOUNTER — Other Ambulatory Visit: Payer: Self-pay | Admitting: Family Medicine

## 2019-06-08 ENCOUNTER — Other Ambulatory Visit: Payer: Self-pay

## 2019-06-08 ENCOUNTER — Ambulatory Visit (INDEPENDENT_AMBULATORY_CARE_PROVIDER_SITE_OTHER): Payer: Managed Care, Other (non HMO) | Admitting: Family Medicine

## 2019-06-08 ENCOUNTER — Encounter: Payer: Self-pay | Admitting: Family Medicine

## 2019-06-08 VITALS — BP 147/83 | Ht 63.0 in | Wt 160.2 lb

## 2019-06-08 DIAGNOSIS — Z9119 Patient's noncompliance with other medical treatment and regimen: Secondary | ICD-10-CM | POA: Diagnosis not present

## 2019-06-08 DIAGNOSIS — F4323 Adjustment disorder with mixed anxiety and depressed mood: Secondary | ICD-10-CM | POA: Diagnosis not present

## 2019-06-08 DIAGNOSIS — I10 Essential (primary) hypertension: Secondary | ICD-10-CM

## 2019-06-08 DIAGNOSIS — R7303 Prediabetes: Secondary | ICD-10-CM

## 2019-06-08 DIAGNOSIS — E782 Mixed hyperlipidemia: Secondary | ICD-10-CM

## 2019-06-08 DIAGNOSIS — K219 Gastro-esophageal reflux disease without esophagitis: Secondary | ICD-10-CM

## 2019-06-08 DIAGNOSIS — E559 Vitamin D deficiency, unspecified: Secondary | ICD-10-CM

## 2019-06-08 DIAGNOSIS — Z91199 Patient's noncompliance with other medical treatment and regimen due to unspecified reason: Secondary | ICD-10-CM

## 2019-06-08 MED ORDER — OMEPRAZOLE 20 MG PO CPDR: 20.0000 mg | DELAYED_RELEASE_CAPSULE | Freq: Every day | ORAL | 0 refills | Status: DC

## 2019-06-08 MED ORDER — VITAMIN D (ERGOCALCIFEROL) 1.25 MG (50000 UNIT) PO CAPS: ORAL_CAPSULE | ORAL | 3 refills | Status: DC

## 2019-06-08 MED ORDER — FLUOXETINE HCL 20 MG PO CAPS: 20.0000 mg | ORAL_CAPSULE | Freq: Every day | ORAL | 0 refills | Status: DC

## 2019-06-08 MED ORDER — TRIAMTERENE-HCTZ 75-50 MG PO TABS: 1.0000 | ORAL_TABLET | Freq: Every day | ORAL | 0 refills | Status: DC

## 2019-06-08 NOTE — Progress Notes (Signed)
Telehealth office visit note for Leah Jimenez, D.O- at Primary Care at [facility]   I connected with current patient today and verified that I am speaking with the correct person using two identifiers.   . Location of the patient: Home . Location of the provider: Office Only the patient (+/- their family members at pt's discretion) and myself were participating in the encounter - This visit type was conducted due to national recommendations for restrictions regarding the COVID-19 Pandemic (e.g. social distancing) in an effort to limit this patient's exposure and mitigate transmission in our community.  This format is felt to be most appropriate for this patient at this time.   - No physical exam could be performed with this format, beyond that communicated to [place] by the patient/ family members as noted.   - Additionally my office staff/ schedulers discussed with the patient that there may be a monetary charge related to this service, depending on their medical insurance.   The patient expressed understanding, and agreed to proceed.     _________________________________________________________________________________   History of Present Illness: Hypertension   I, Toni Amend, am serving as Education administrator for [employer].  Notes today "I'm good."  Her family is doing well.  - Out of Medications Says she has to take the omeprazole for her heartburn, and has been purchasing this over the counter, "but as far as everything else, I'm not taking it."  She has been out of meds for several months.  For example, reports that she has not been taking fluoxetine or triamterene.    - Mood Management She thinks that her mood has been greatly affected by her job/occupational status.  Notes she's angry with herself for allowing her self-worth to be defined by a job, but she does track her change in mood back to her occupational changes.  She was on Buspar for anxiety and excess worry in the  past, while going through turbulent times.  Feels fine without the Buspar for now; "I feel okay with that in general," but thinks she still needs the Fluoxetine.   Notes "every now and then, I'll get a little moody, and I think that [the fluoxetine] was good for me."  - GERD Stable on omeprazole OTC.  HPI:  Hypertension:  She does not check her blood pressure regularly at home.  When she does check her blood pressure, she uses a wrist cuff.    Her blood pressure was elevated on intake today.  Says she plans to take her blood pressure periodically throughout the day today to monitor herself.  She took her BP again later and it was 147/83, and then she took it again later and it was 80/40.  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  She has been out of medications and not taking medications for several months.  Notes that her OBGYN told the patient that she was retaining water, so she wishes to resume her blood pressure medications.  - She denies new onset of: chest pain, exercise intolerance, shortness of breath, dizziness, visual changes, headache, lower extremity swelling or claudication.   Last 3 blood pressure readings in our office are as follows: BP Readings from Last 3 Encounters:  06/08/19 (!) 147/83  12/28/17 122/79  07/14/17 135/86   Filed Weights   06/08/19 0850  Weight: 160 lb 3.2 oz (72.7 kg)  GAD 7 : Generalized Anxiety Score 09/28/2018  Nervous, Anxious, on Edge 1  Control/stop worrying 1  Worry too much - different things 1  Trouble relaxing 1  Restless 1  Easily annoyed or irritable 0  Afraid - awful might happen 0  Total GAD 7 Score 5  Anxiety Difficulty Very difficult    Depression screen [facility] 2/9 06/08/2019 09/28/2018 12/28/2017 07/14/2017 06/02/2017  Decreased Interest 0 0 0 0 0  Down, Depressed, Hopeless 0 2 0 0 0  PHQ - 2 Score 0 2 0 0 0  Altered sleeping 1 2 0  0 0  Tired, decreased energy 0 1 0 2 0  Change in appetite 0 2 0 0 0  Feeling bad or failure about yourself  0 2 0 0 0  Trouble concentrating 0 1 0 0 0  Moving slowly or fidgety/restless 0 1 0 0 0  Suicidal thoughts 0 1 0 0 0  PHQ-9 Score 1 12 0 2 0  Difficult doing work/chores Not difficult at all Very difficult Not difficult at all Not difficult at all Not difficult at all      Impression and Recommendations:    1. Adjustment disorder with mixed anxiety and depressed mood   2. Essential hypertension   3. Gastroesophageal reflux disease, unspecified whether esophagitis present   4. Prediabetes   5. Mixed hyperlipidemia   6. Vitamin D deficiency      -  Per patient, ran out of her medications several months ago.   Adjustment Disorder w/ Mixed Anxiety & Depressed Mood - Per patient, has not been taking her medications for several months. - Patient was managed on 80 mg Prozac prior.  - Advised beginning 20 mg Prozac today.  Will increase dose as needed. - Discussed process of prudently weaning patient back onto medications.  - Patient knows to keep clinic updated as she adjusts to medication. - Patient will call in if she desires to resume Buspar in addition.  - Return for follow-up end of April for progress on Prozac.  Gastroesophageal Reflux Disease - Stable on medication, omeprazole OTC. - Refill provided today. - Continue treatment plan as established.  See med list. - Will continue to monitor.  Vitamin D Deficiency - Per patient, has not been taking her medications for several months. - Resume Vitamin D supplementation.  See med list. - Continue management as established. - Will re-check near future during CPE as discussed.  Prediabetes - Need for re-check near future. - Will continue to monitor.  Mixed Hyperlipidemia - Need for re-check near future. - Will continue to monitor.  Essential Hypertension - Per patient, has not been taking her medications for  several months. - Blood pressure elevated on intake today.  - Resume blood pressure medication today.  See med list. - Advised patient to obtain an upper arm blood pressure cuff and bring BP log to next OV.  - Counseled patient on pathophysiology of disease and discussed various treatment options, which always includes dietary and lifestyle modification as first line.   - Lifestyle changes such as dash and heart healthy diets and engaging in a regular exercise program discussed extensively with patient.   - Ambulatory blood pressure monitoring encouraged at least 3 times weekly.  Keep log and bring in every office visit.  Reminded patient that if they ever feel poorly in any way, to check their blood pressure and pulse.  - We will continue to monitor.  Recommendations - Return near future for CPE  and full fasting lab work same day. - Return OV late April to discuss progress resuming Prozac.    - As part of my medical decision making, I reviewed the following data within the electronic MEDICAL RECORD NUMBER History obtained from pt /family, CMA notes reviewed and incorporated if applicable, Labs reviewed, Radiograph/ tests reviewed if applicable and OV notes from prior OV's with me, as well as other specialists she/he has seen since seeing me last, were all reviewed and used in my medical decision making process today.    - Additionally, discussion had with patient regarding our treatment plan, and their biases/concerns about that plan were used in my medical decision making today.    - The patient agreed with the plan and demonstrated an understanding of the instructions.   No barriers to understanding were identified.     Return for OV late April for progress on resuming Prozac, CPE and full labs near future.    No orders of the defined types were placed in this encounter.   Meds ordered this encounter  Medications  . triamterene-hydrochlorothiazide (MAXZIDE) 75-50 MG tablet    Sig:  Take 1 tablet by mouth daily.    Dispense:  90 tablet    Refill:  0  . Vitamin D, Ergocalciferol, (DRISDOL) 1.25 MG (50000 UNIT) CAPS capsule    Sig: TAKE 1 CAPSULE BY MOUTH EVERY 7 DAYS AS DIRECTED    Dispense:  12 capsule    Refill:  3  . FLUoxetine (PROZAC) 20 MG capsule    Sig: Take 1 capsule (20 mg total) by mouth daily.    Dispense:  90 capsule    Refill:  0  . omeprazole (PRILOSEC) 20 MG capsule    Sig: Take 1 capsule (20 mg total) by mouth daily.    Dispense:  90 capsule    Refill:  0    Medications Discontinued During This Encounter  Medication Reason  . FLUoxetine (PROZAC) 40 MG capsule   . triamterene-hydrochlorothiazide (MAXZIDE) 75-50 MG tablet Reorder  . Vitamin D, Ergocalciferol, (DRISDOL) 1.25 MG (50000 UT) CAPS capsule Reorder  . omeprazole (PRILOSEC) 20 MG capsule Reorder     Time spent on visit including pre-visit chart review and post-visit care was 21 minutes.  Note:  This note was prepared with assistance of Dragon voice recognition software. Occasional wrong-word or sound-a-like substitutions may have occurred due to the inherent limitations of voice recognition software.   The 21st Century Cures Act was signed into law in 2016 which includes the topic of electronic health records.  This provides immediate access to information in MyChart.  This includes consultation notes, operative notes, office notes, lab results and pathology reports.  If you have any questions about what you read please let us know at your next visit or call us at the office.  We are right here with you.   This document serves as a record of services personally performed by Thomasene Lot, DO. It was created on her behalf by Peggye Fothergill, a trained medical scribe. The creation of this record is based on the scribe's personal observations and the provider's statements to them.   This case required medical decision making of at least moderate complexity. The above documentation  from Peggye Fothergill, medical scribe, has been reviewed by Carlye Grippe, D.O.    __________________________________________________________________________________     Patient Care Team    Relationship Specialty Notifications Start End  Thomasene Lot, DO PCP - General Family Medicine  10/01/15      -  Vitals obtained; medications/ allergies reconciled;  personal medical, social, Sx etc.histories were updated by CMA, reviewed by me and are reflected in chart   Patient Active Problem List   Diagnosis Date Noted  . Prediabetes 10/22/2015  . Essential hypertension 10/22/2015  . HLD: elevated LDL 10/22/2015  . Overweight (BMI 25.0-29.9) 10/01/2015  . Dysthymia 12/15/2008  . GERD (gastroesophageal reflux disease) 01/30/2016  . OSA on CPAP 03/06/2009  . HYPERSOMNIA 12/26/2008  . Vitamin D deficiency 10/22/2015  . Mood disorder (HCC) 11/18/2018  . Adjustment disorder with mixed anxiety and depressed mood 11/18/2018  . Panic attack as reaction to stress 11/18/2018  . Loss of job 11/18/2018  . Loud snoring 12/28/2017  . Excessive daytime sleepiness 12/28/2017  . ALLERGIC RHINITIS 12/26/2008  . Constipation 12/13/2008  . RECTAL BLEEDING 12/13/2008     Current Meds  Medication Sig  . omeprazole (PRILOSEC) 20 MG capsule Take 1 capsule (20 mg total) by mouth daily.  Marland Kitchen triamterene-hydrochlorothiazide (MAXZIDE) 75-50 MG tablet Take 1 tablet by mouth daily.  . Vitamin D, Ergocalciferol, (DRISDOL) 1.25 MG (50000 UNIT) CAPS capsule TAKE 1 CAPSULE BY MOUTH EVERY 7 DAYS AS DIRECTED  . [DISCONTINUED] FLUoxetine (PROZAC) 40 MG capsule Take 2 capsules (80 mg total) by mouth daily.  . [DISCONTINUED] omeprazole (PRILOSEC) 20 MG capsule Take 1 capsule (20 mg total) by mouth daily.  . [DISCONTINUED] triamterene-hydrochlorothiazide (MAXZIDE) 75-50 MG tablet Take 1 tablet by mouth daily.  . [DISCONTINUED] Vitamin D, Ergocalciferol, (DRISDOL) 1.25 MG (50000 UT) CAPS capsule TAKE 1 CAPSULE  BY MOUTH EVERY 7 DAYS AS DIRECTED     Allergies:  No Known Allergies   ROS:  See above HPI for pertinent positives and negatives   Objective:   Blood pressure (!) 147/83, height 5\' 3"  (1.6 m), weight 160 lb 3.2 oz (72.7 kg), last menstrual period 06/01/2019.  (if some vitals are omitted, this means that patient was UNABLE to obtain them even though they were asked to get them prior to OV today.  They were asked to call 06/03/2019 at their earliest convenience with these once obtained. )  General: A & O * 3; sounds in no acute distress; in usual state of health.  Skin: Pt confirms warm and dry extremities and pink fingertips HEENT: Pt confirms lips non-cyanotic Chest: Patient confirms normal chest excursion and movement Respiratory: speaking in full sentences, no conversational dyspnea; patient confirms no use of accessory muscles Psych: insight appears good, mood- appears full

## 2019-06-08 NOTE — Patient Instructions (Signed)
Omron series 3 upper arm monitor

## 2019-06-10 DIAGNOSIS — Z9119 Patient's noncompliance with other medical treatment and regimen: Secondary | ICD-10-CM | POA: Insufficient documentation

## 2019-06-10 DIAGNOSIS — Z91199 Patient's noncompliance with other medical treatment and regimen due to unspecified reason: Secondary | ICD-10-CM | POA: Insufficient documentation

## 2019-08-01 ENCOUNTER — Telehealth (INDEPENDENT_AMBULATORY_CARE_PROVIDER_SITE_OTHER): Payer: Managed Care, Other (non HMO) | Admitting: Family Medicine

## 2019-08-01 ENCOUNTER — Encounter: Payer: Self-pay | Admitting: Family Medicine

## 2019-08-01 ENCOUNTER — Other Ambulatory Visit: Payer: Self-pay

## 2019-08-01 VITALS — BP 137/84 | HR 74 | Ht 63.0 in | Wt 154.0 lb

## 2019-08-01 DIAGNOSIS — F410 Panic disorder [episodic paroxysmal anxiety] without agoraphobia: Secondary | ICD-10-CM

## 2019-08-01 DIAGNOSIS — G4719 Other hypersomnia: Secondary | ICD-10-CM

## 2019-08-01 DIAGNOSIS — F430 Acute stress reaction: Secondary | ICD-10-CM

## 2019-08-01 DIAGNOSIS — I10 Essential (primary) hypertension: Secondary | ICD-10-CM | POA: Diagnosis not present

## 2019-08-01 DIAGNOSIS — K219 Gastro-esophageal reflux disease without esophagitis: Secondary | ICD-10-CM | POA: Diagnosis not present

## 2019-08-01 DIAGNOSIS — F39 Unspecified mood [affective] disorder: Secondary | ICD-10-CM | POA: Diagnosis not present

## 2019-08-01 DIAGNOSIS — F4323 Adjustment disorder with mixed anxiety and depressed mood: Secondary | ICD-10-CM | POA: Diagnosis not present

## 2019-08-01 DIAGNOSIS — R0683 Snoring: Secondary | ICD-10-CM

## 2019-08-01 DIAGNOSIS — E559 Vitamin D deficiency, unspecified: Secondary | ICD-10-CM

## 2019-08-01 DIAGNOSIS — G4752 REM sleep behavior disorder: Secondary | ICD-10-CM | POA: Insufficient documentation

## 2019-08-01 MED ORDER — FLUOXETINE HCL 20 MG PO CAPS: 20.0000 mg | ORAL_CAPSULE | Freq: Every day | ORAL | 1 refills | Status: DC

## 2019-08-01 MED ORDER — BUSPIRONE HCL 10 MG PO TABS: ORAL_TABLET | ORAL | 1 refills | Status: DC

## 2019-08-01 MED ORDER — OMEPRAZOLE 20 MG PO CPDR: 20.0000 mg | DELAYED_RELEASE_CAPSULE | Freq: Every day | ORAL | 1 refills | Status: DC

## 2019-08-01 MED ORDER — TRIAMTERENE-HCTZ 75-50 MG PO TABS: 1.0000 | ORAL_TABLET | Freq: Every day | ORAL | 1 refills | Status: DC

## 2019-08-01 MED ORDER — VITAMIN D (ERGOCALCIFEROL) 1.25 MG (50000 UNIT) PO CAPS: ORAL_CAPSULE | ORAL | 3 refills | Status: DC

## 2019-08-01 NOTE — Patient Instructions (Addendum)
Referral to sleep study placed today. Please call Joselyn Glassman of front desk if you have not heard about this referral in one week.  Resume Buspar as discussed today for management of increased anxiety.  Pick up your new medications at the pharmacy  For assistance with mindfulness and meditation, look into use of apps such as Calm, Headspace, Ten Percent, Breathe.  Look into the use of self-help books to aid with self-care and positivity, such as "Un-F**k Your Brain," etc.  Engage in increased exercise, which can be several at least 5-minute walks daily.  Please ask work about EAP and establishing with a counselor/therapist. If you need a referral please let us know!!    If you need medication refills, please be sure to call your pharmacy to request them.   This is the fastest and easiest way for you to obtain refills.   However, if a refill is not appropriate, one of our staff members or the pharmacy should contact you to let you know why it was denied.    - Please feel free to contact Clayborne Artist at the front desk, or our practice manager Haskell Riling) with any questions or concerns you have with the day-to-day activities of our practice.      Haskell Riling Practice manager of Primary Care at [facility] ( usually present on Thursdays at our office ) - Her Office number: [phone] - Work Email: [email or web address]    Clayborne Artist   - Call the office to speak with him - work email:  [email or web address]

## 2019-08-01 NOTE — Progress Notes (Signed)
Telehealth office visit note for Leah Jimenez, D.O- at Primary Care at [facility]   I connected with current patient today and verified that I am speaking with the correct person   . Location of the patient: Home . Location of the provider: Office - This visit type was conducted due to national recommendations for restrictions regarding the COVID-19 Pandemic (e.g. social distancing) in an effort to limit this patient's exposure and mitigate transmission in our community.    - No physical exam could be performed with this format, beyond that communicated to [place] by the patient/ family members as noted.   - Additionally my office staff/ schedulers were to discuss with the patient that there may be a monetary charge related to this service, depending on their medical insurance.  My understanding is that patient understood and consented to proceed.     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  She has obtained her COVID-19 vaccine.  Notes her twins are doing fine, both in school and still working.  - Mood Management She continues taking fluoxetine, back down to 20 mg daily.  She was thinking today about whether she needs to increase her dosage or not.  "I think frankly, I've been fine."  In general, believes she has more anxious thoughts than depressed thoughts.  When asked why she discontinued Buspar in the past, she states it was because things were starting to look up.  At that time, she found a new job and was excited about being the prospect of being able to contribute again.    In the past, when she did take the Buspar, she doesn't recall whether her anxiety was better or worse.  Says at the time, "honestly, mentally, I was thinking 'you're fine.'"  Her job continues to affect her mood.  Overall, she continues trying to stay positive; watching  as little negative news as possible.  States "I think I over-think stuff."  Notes with her current job, it's less flexible than she's been used to in the past.  Regarding her schedule, says "it changes every two weeks," causing difficulty with arranging doctor's visits in advance.  Experienced some acute stress recently when her mother was rushed to the hospital with cardiac issues.  - Sleep Habits She started taking melatonin for sleep, which was helpful.  Now she uses the melatonin only as needed.  At times, she wakes up in the middle of the night to go to the bathroom.   Confirms that she continues to experience difficulty with snoring, and is still interested in a sleep study for further evaluation.  - Exercise For exercise, she walks during her breaks, and in the morning before work.  At lunch, she takes the dog for a walk.  - GERD Continues omeprazole.  HPI:  Hypertension:  Notes she recently checked her blood pressure three times in a row.  The first check, her BP was 154/99, with a pulse of 77.  During the second check, her BP was 148/90, with a pulse of 73.  The third time, her blood pressure was 137/84, with a pulse of 74.  - Patient reports good compliance with medication and/or lifestyle modification.  - Her denies acute concerns or problems related to treatment plan  - She denies new onset of: chest pain, exercise intolerance, shortness of breath, dizziness, visual changes, headache, lower extremity swelling or claudication.   Last 3  blood pressure readings in our office are as follows: BP Readings from Last 3 Encounters:  08/01/19 137/84  06/08/19 (!) 147/83  12/28/17 122/79   Filed Weights   08/01/19 1519  Weight: 154 lb (69.9 kg)      GAD 7 : Generalized Anxiety Score 09/28/2018  Nervous, Anxious, on Edge 1  Control/stop worrying 1  Worry too much - different things 1  Trouble relaxing 1  Restless 1  Easily annoyed or irritable 0  Afraid - awful might  happen 0  Total GAD 7 Score 5  Anxiety Difficulty Very difficult    Depression screen [facility] 2/9 08/01/2019 06/08/2019 09/28/2018 12/28/2017 07/14/2017  Decreased Interest 0 0 0 0 0  Down, Depressed, Hopeless 0 0 2 0 0  PHQ - 2 Score 0 0 2 0 0  Altered sleeping 1 1 2  0 0  Tired, decreased energy 0 0 1 0 2  Change in appetite 0 0 2 0 0  Feeling bad or failure about yourself  0 0 2 0 0  Trouble concentrating 0 0 1 0 0  Moving slowly or fidgety/restless 0 0 1 0 0  Suicidal thoughts 0 0 1 0 0  PHQ-9 Score 1 1 12  0 2  Difficult doing work/chores Not difficult at all Not difficult at all Very difficult Not difficult at all Not difficult at all      Impression and Recommendations:     1. Adjustment disorder with mixed anxiety and depressed mood   2. Essential hypertension   3. Gastroesophageal reflux disease, unspecified whether esophagitis present   4. Mood disorder (HCC)   5. Vitamin D deficiency   6. Excessive daytime sleepiness   7. Loud snoring   8. Panic attack as reaction to stress   9. Uncontrolled REM sleep behavior disorder-   "thrashes around at night" -very disruptive sleep      Loud Snoring, Excessive Daytime Sleepiness - Need for Sleep Study - Discussed patient's history of loud snoring and tiredness during the day. - Per patient, will wear a CPAP machine at night if needed.  - Ambulatory referral to sleep study placed today.  See orders. - If she has not heard back about scheduling within one week, advised patient to call of front desk by Wednesday and ask about the status of her sleep study referral.  - Will continue to monitor.   Mood Disorder, Adjustment Disorder with Mixed Anxiety & Depressed Mood - Continues management on Prozac. - Per patient, symptoms stable on current dosage.  - Continue as established.  See med list.  - If patient worries more about symptoms of anxiety than depression, recommended resuming management on Buspar today.  - Patient  agrees to resume Buspar today.  See med list.  - The patient agrees to speak with EAP regarding options for therapy/counseling.  - In addition to prescription intervention and the option of therapy/counseling, reviewed the "spokes of the wheel" of mood and health management.  Stressed the importance of ongoing prudent habits, including regular exercise, appropriate sleep hygiene, healthful dietary habits, and prayer/meditation to relax.  - Advised patient to look into self-help books and other such techniques to work on mindfulness, self-awareness, positive thinking, etc.  - Encouraged patient to walk and engage in meditation utilizing apps such as Calm, Headspace, Ten Percent, Breathe, etc.  - Will continue to monitor.   GERD - Stable on current management. - Continue omeprazole as prescribed.  See med list.  - Will continue to  monitor.   Essential Hypertension - Blood pressure currently is elevated on intake.  - Per patient, BP stable at home. - Patient will continue current treatment regimen.  See med list.  - Counseled patient on pathophysiology of disease and discussed various treatment options, which always includes dietary and lifestyle modification as first line.   - Lifestyle changes such as dash and heart healthy diets and engaging in a regular exercise program discussed extensively with patient.   - Ambulatory blood pressure monitoring encouraged at least twice per week.   - Advised patient to sit for ten minutes prior to checking her blood pressure.  - Advised patient to keep log of BP and pulse and bring in every office visit.  Reminded patient that if she ever feels poorly in any way, to check her blood pressure and pulse.  - We will continue to monitor.   Health Counseling & Preventative Maintenance - Advised patient to continue working toward exercising to improve overall mental, physical, and emotional health.    - Encouraged patient to engage in daily  physical activity as tolerated, especially a formal exercise routine.  Recommended that the patient eventually strive for at least 150 minutes of moderate cardiovascular activity per week according to guidelines established by the [facility].   - Healthy dietary habits encouraged, including low-carb, and high amounts of lean protein in diet.   - Patient should also consume adequate amounts of water.  - Health counseling performed.  All questions answered.    - As part of my medical decision making, I reviewed the following data within the electronic MEDICAL RECORD NUMBER History obtained from pt /family, CMA notes reviewed and incorporated if applicable, Labs reviewed, Radiograph/ tests reviewed if applicable and OV notes from prior OV's with me, as well as other specialists she/he has seen since seeing me last, were all reviewed and used in my medical decision making process today.    - Additionally, when appropriate, discussion had with patient regarding our treatment plan, and their biases/concerns about that plan were used in my medical decision making today.    - The patient agreed with the plan and demonstrated an understanding of the instructions.   No barriers to understanding were identified.     - The patient was advised to call back or seek an in-person evaluation if the symptoms worsen or if the condition fails to improve as anticipated.   Return for f/up in-person in 4 months for progress after starting Buspar.    Orders Placed This Encounter  Procedures  . Ambulatory referral to Sleep Studies    Meds ordered this encounter  Medications  . busPIRone (BUSPAR) 10 MG tablet    Sig: 0.5 tabs po tid for 3wks, then inc to 1 po TID if tolerated for anxiety    Dispense:  270 tablet    Refill:  1  . FLUoxetine (PROZAC) 20 MG capsule    Sig: Take 1 capsule (20 mg total) by mouth daily.    Dispense:  90 capsule    Refill:  1  . triamterene-hydrochlorothiazide (MAXZIDE) 75-50 MG tablet     Sig: Take 1 tablet by mouth daily.    Dispense:  90 tablet    Refill:  1  . omeprazole (PRILOSEC) 20 MG capsule    Sig: Take 1 capsule (20 mg total) by mouth daily.    Dispense:  90 capsule    Refill:  1  . Vitamin D, Ergocalciferol, (DRISDOL) 1.25 MG (50000 UNIT) CAPS capsule  Sig: TAKE 1 CAPSULE BY MOUTH EVERY 7 DAYS AS DIRECTED    Dispense:  12 capsule    Refill:  3    Medications Discontinued During This Encounter  Medication Reason  . busPIRone (BUSPAR) 10 MG tablet Reorder  . triamterene-hydrochlorothiazide (MAXZIDE) 75-50 MG tablet Reorder  . Vitamin D, Ergocalciferol, (DRISDOL) 1.25 MG (50000 UNIT) CAPS capsule Reorder  . FLUoxetine (PROZAC) 20 MG capsule Reorder  . omeprazole (PRILOSEC) 20 MG capsule Reorder     Time spent on visit including pre-visit chart review and post-visit care was 36 minutes.  Note:  This note was prepared with assistance of Dragon voice recognition software. Occasional wrong-word or sound-a-like substitutions may have occurred due to the inherent limitations of voice recognition software.  The 21st Century Cures Act was signed into law in 2016 which includes the topic of electronic health records.  This provides immediate access to information in MyChart.  This includes consultation notes, operative notes, office notes, lab results and pathology reports.  If you have any questions about what you read please let us know at your next visit or call us at the office.  We are right here with you.  This document serves as a record of services personally performed by Thomasene Loteborah Edwyna Dangerfield, DO. It was created on her behalf by Peggye FothergillKatherine Galloway, a trained medical scribe. The creation of this record is based on the scribe's personal observations and the provider's statements to them.    The above documentation from Peggye FothergillKatherine Galloway, medical scribe, has been reviewed by Carlye Grippeeborah J. Adrianna Dudas,  D.O.    __________________________________________________________________________________     Patient Care Team    Relationship Specialty Notifications Start End  Thomasene Lotpalski, Shaelin Lalley, DO PCP - General Family Medicine  10/01/15      -Vitals obtained; medications/ allergies reconciled;  personal medical, social, Sx etc.histories were updated by CMA, reviewed by me and are reflected in chart   Patient Active Problem List   Diagnosis Date Noted  . Prediabetes 10/22/2015  . Essential hypertension 10/22/2015  . HLD: elevated LDL 10/22/2015  . Overweight (BMI 25.0-29.9) 10/01/2015  . Dysthymia 12/15/2008  . GERD (gastroesophageal reflux disease) 01/30/2016  . OSA on CPAP 03/06/2009  . HYPERSOMNIA 12/26/2008  . Vitamin D deficiency 10/22/2015  . Uncontrolled REM sleep behavior disorder-   "thrashes around at night" -very disruptive sleep 08/01/2019  . H/O noncompliance with medical treatment, presenting hazards to health 06/10/2019  . Mood disorder (HCC) 11/18/2018  . Adjustment disorder with mixed anxiety and depressed mood 11/18/2018  . Panic attack as reaction to stress 11/18/2018  . Loss of job 11/18/2018  . Loud snoring 12/28/2017  . Excessive daytime sleepiness 12/28/2017  . ALLERGIC RHINITIS 12/26/2008  . Constipation 12/13/2008  . RECTAL BLEEDING 12/13/2008     Current Meds  Medication Sig  . FLUoxetine (PROZAC) 20 MG capsule Take 1 capsule (20 mg total) by mouth daily.  Marland Kitchen. omeprazole (PRILOSEC) 20 MG capsule Take 1 capsule (20 mg total) by mouth daily.  Marland Kitchen. triamterene-hydrochlorothiazide (MAXZIDE) 75-50 MG tablet Take 1 tablet by mouth daily.  . Vitamin D, Ergocalciferol, (DRISDOL) 1.25 MG (50000 UNIT) CAPS capsule TAKE 1 CAPSULE BY MOUTH EVERY 7 DAYS AS DIRECTED  . [DISCONTINUED] FLUoxetine (PROZAC) 20 MG capsule Take 1 capsule (20 mg total) by mouth daily.  . [DISCONTINUED] omeprazole (PRILOSEC) 20 MG capsule Take 1 capsule (20 mg total) by mouth daily.  .  [DISCONTINUED] triamterene-hydrochlorothiazide (MAXZIDE) 75-50 MG tablet Take 1 tablet by mouth daily.  . [DISCONTINUED]  Vitamin D, Ergocalciferol, (DRISDOL) 1.25 MG (50000 UNIT) CAPS capsule TAKE 1 CAPSULE BY MOUTH EVERY 7 DAYS AS DIRECTED     Allergies:  No Known Allergies   ROS:  See above HPI for pertinent positives and negatives   Objective:   Blood pressure 137/84, pulse 74, height 5\' 3"  (1.6 m), weight 154 lb (69.9 kg), last menstrual period 06/03/2019.  (if some vitals are omitted, this means that patient was UNABLE to obtain them even though they were asked to get them prior to OV today.  They were asked to call 06/05/2019 at their earliest convenience with these once obtained. ) General: A & O * 3; sounds in no acute distress; in usual state of health.  Skin: Pt confirms warm and dry extremities and pink fingertips HEENT: Pt confirms lips non-cyanotic Chest: Patient confirms normal chest excursion and movement Respiratory: speaking in full sentences, no conversational dyspnea; patient confirms no use of accessory muscles Psych: insight appears good, mood- appears full

## 2019-08-15 ENCOUNTER — Telehealth: Payer: Self-pay | Admitting: Neurology

## 2019-08-15 ENCOUNTER — Institutional Professional Consult (permissible substitution): Payer: Self-pay | Admitting: Neurology

## 2019-08-15 NOTE — Telephone Encounter (Signed)
PT was a no show to the Sleep consult

## 2019-10-09 ENCOUNTER — Other Ambulatory Visit: Payer: Self-pay | Admitting: Family Medicine

## 2019-10-09 DIAGNOSIS — K219 Gastro-esophageal reflux disease without esophagitis: Secondary | ICD-10-CM

## 2020-05-13 ENCOUNTER — Encounter: Payer: Managed Care, Other (non HMO) | Admitting: Physician Assistant

## 2020-05-13 NOTE — Progress Notes (Deleted)
Female Physical   Impression and Recommendations:    No diagnosis found.   1) Anticipatory Guidance: Discussed skin CA prevention and sunscreen when outside along with skin surveillance; eating a balanced and modest diet; physical activity at least 25 minutes per day or minimum of 150 min/ week moderate to intense activity.  2) Immunizations / Screenings / Labs:   All immunizations are up-to-date per recommendations or will be updated today if pt allows.    - Patient understands with dental and vision screens they will schedule independently.  - Will obtain CBC, CMP, HgA1c, Lipid panel, TSH and vit D when fasting, if not already done past 12 mo/ recently   3) Weight:  BMI meaning discussed with patient.  Discussed goal to improve diet habits to improve overall feelings of well being and objective health data. Improve nutrient density of diet through increasing intake of fruits and vegetables and decreasing saturated fats, white flour products and refined sugars.   No orders of the defined types were placed in this encounter.   No orders of the defined types were placed in this encounter.    No follow-ups on file.   Reminded pt important of f-up preventative CPE in 1 year.  Reminded pt again, this is in addition to any chronic care visits.    Gross side effects, risk and benefits, and alternatives of medications discussed with patient.  Patient is aware that all medications have potential side effects and we are unable to predict every side effect or drug-drug interaction that may occur.  Expresses verbal understanding and consents to current therapy plan and treatment regimen.  F-up preventative CPE in 1 year- reminded pt again, this is in addition to any chronic care visits.    Please see orders placed and AVS handed out to patient at the end of our visit for further patient instructions/ counseling done pertaining to today's office visit.   This document serves as a record  of services personally performed by Mellody Dance, DO. It was created on her behalf by Toni Amend, a trained medical scribe. The creation of this record is based on the scribe's personal observations and the provider's statements to them.   This case required medical decision making of at least moderate complexity. The above documentation from Toni Amend, medical scribe, has been reviewed by Mellody Dance, D.O.     Subjective:     CPE HPI: Leah Jimenez is a 55 y.o. female who presents to [facility] today for a yearly health maintenance exam.   Health Maintenance Summary  - Reviewed and updated, unless pt declines services.  Last Cologuard or Colonoscopy:    ***  ( must be 71 - 40 years old.) Family history of Colon CA:  ***  Tobacco History Reviewed:  Y CT scan for screening lung CA:   ***   ( to qualify must be 55-80yo AND 30 pk yr hx AND currently smokes or quit less than 15 yrs ago)   Alcohol and/or drug use:    No concerns; no use Exercise Habits:   Not meeting AHA guidelines Dental Home: Eye exams: Dermatology home:  Female Health:  PAP Smear - last known results:  ***  no h/o ABN STD concerns:   none, monogamous Birth control method:  ***  n/a Menses regular:   ***  n/a Lumps or breast concerns:    none Breast Cancer Family History:    ***  No Bone/ DEXA scan:    ***  (  Unnecessary due to < 65 and average risk)    Additional concerns beyond health maintenance issues:     Immunization History  Administered Date(s) Administered  . Influenza,inj,Quad PF,6+ Mos 01/30/2016  . MMR 12/11/2015  . Tdap 12/11/2015     Health Maintenance  Topic Date Due  . Hepatitis C Screening  Never done  . COVID-19 Vaccine (1) Never done  . MAMMOGRAM  Never done  . Fecal DNA (Cologuard)  Never done  . PAP SMEAR-Modifier  09/26/2017  . INFLUENZA VACCINE  11/04/2019  . TETANUS/TDAP  12/10/2025  . HIV Screening  Completed      Wt Readings from Last 3 Encounters:  08/01/19 154 lb (69.9 kg)  06/08/19 160 lb 3.2 oz (72.7 kg)  12/28/17 163 lb 3.2 oz (74 kg)   BP Readings from Last 3 Encounters:  08/01/19 137/84  06/08/19 (!) 147/83  12/28/17 122/79   Pulse Readings from Last 3 Encounters:  08/01/19 74  12/28/17 77  07/14/17 67     Past Medical History:  Diagnosis Date  . Anxiety    [employer]reesia 05/12/2020  . Constipation   . Depression       Past Surgical History:  Procedure Laterality Date  . ENDOMETRIAL ABLATION    . EYE SURGERY    . TUBAL LIGATION        Family History  Problem Relation Age of Onset  . Stroke Mother   . High Cholesterol Mother   . Hypertension Mother   . Stroke Father   . High Cholesterol Father   . Hypertension Father   . High Cholesterol Sister       Social History   Substance and Sexual Activity  Drug Use No  ,   Social History   Substance and Sexual Activity  Alcohol Use Yes  . Alcohol/week: 3.0 standard drinks  . Types: 3 Cans of beer per week  ,   Social History   Tobacco Use  Smoking Status Former Smoker  . Packs/day: 0.25  . Years: 11.00  . Pack years: 2.75  . Types: Cigarettes  . Quit date: 06/08/1994  . Years since quitting: 25.9  Smokeless Tobacco Never Used  ,   Social History   Substance and Sexual Activity  Sexual Activity Yes    Current Outpatient Medications on File Prior to Visit  Medication Sig Dispense Refill  . busPIRone (BUSPAR) 10 MG tablet 0.5 tabs po tid for 3wks, then inc to 1 po TID if tolerated for anxiety 270 tablet 1  . FLUoxetine (PROZAC) 20 MG capsule Take 1 capsule (20 mg total) by mouth daily. 90 capsule 1  . omeprazole (PRILOSEC) 20 MG capsule Take 1 capsule (20 mg total) by mouth daily. 90 capsule 1  . triamterene-hydrochlorothiazide (MAXZIDE) 75-50 MG tablet Take 1 tablet by mouth daily. 90 tablet 1  . Vitamin D, Ergocalciferol, (DRISDOL) 1.25 MG (50000 UNIT) CAPS capsule TAKE 1 CAPSULE BY MOUTH  EVERY 7 DAYS AS DIRECTED 12 capsule 3   No current facility-administered medications on file prior to visit.    Allergies: Patient has no known allergies.  Review of Systems: General:   Denies fever, chills, unexplained weight loss.  Optho/Auditory:   Denies visual changes, blurred vision/LOV Respiratory:   Denies SOB, DOE more than baseline levels.   Cardiovascular:   Denies chest pain, palpitations, new onset peripheral edema  Gastrointestinal:   Denies nausea, vomiting, diarrhea.  Genitourinary: Denies dysuria, freq/ urgency, flank pain or discharge from genitals.  Endocrine:  Denies hot or cold intolerance, polyuria, polydipsia. Musculoskeletal:   Denies unexplained myalgias, joint swelling, unexplained arthralgias, gait problems.  Skin:  Denies rash, suspicious lesions Neurological:     Denies dizziness, unexplained weakness, numbness  Psychiatric/Behavioral:   Denies mood changes, suicidal or homicidal ideations, hallucinations    Objective:    There were no vitals taken for this visit. There is no height or weight on file to calculate BMI. General Appearance:    Alert, cooperative, no distress, appears stated age  Head:    Normocephalic, without obvious abnormality, atraumatic  Eyes:    PERRL, conjunctiva/corneas clear, EOM's intact, both eyes  Ears:    Normal TM's and external ear canals, both ears  Nose:   Nares normal, septum midline, mucosa normal, no drainage    or sinus tenderness  Throat:   Lips w/o lesion, mucosa moist, and tongue normal; teeth and   gums normal  Neck:   Supple, symmetrical, trachea midline, no adenopathy;    thyroid:  no enlargement/tenderness/nodules  Back:     Symmetric, no curvature, ROM normal, no CVA tenderness  Lungs:     Clear to auscultation bilaterally, respirations unlabored, no       Wh/ R/ R  Chest Wall:    No tenderness or gross deformity; normal excursion   Heart:    Regular rate and rhythm, S1 and S2 normal, no murmur, rub   or  gallop  Breast Exam:    ***No tenderness, masses, or nipple abnormality b/l; no d/c  Abdomen:     Soft, non-tender, bowel sounds active all four quadrants, No  G/R/R, no masses, no organomegaly  Genitalia:    ***Ext genitalia: without lesion, no rash or discharge, No        tenderness;  Cervix: WNL's w/o discharge or lesion;        Adnexa:  No tenderness or palpable masses   Rectal:    ***Normal tone, no masses or tenderness;   guaiac negative stool  Extremities:   Extremities normal, atraumatic, no cyanosis or gross edema  Pulses:   2+ and symmetric all extremities  Skin:   Warm, dry, Skin color, texture, turgor normal, no obvious rashes or lesions Psych: No HI/SI, judgement and insight good, Euthymic mood. Full Affect.  Neurologic:   CNII-XII grossly intact, normal strength, sensation and reflexes throughout

## 2020-09-23 ENCOUNTER — Other Ambulatory Visit: Payer: Self-pay

## 2020-09-23 ENCOUNTER — Encounter: Payer: Self-pay | Admitting: Nurse Practitioner

## 2020-09-23 ENCOUNTER — Ambulatory Visit (INDEPENDENT_AMBULATORY_CARE_PROVIDER_SITE_OTHER): Payer: Managed Care, Other (non HMO) | Admitting: Nurse Practitioner

## 2020-09-23 VITALS — BP 117/79 | HR 82 | Temp 98.5°F | Ht 63.0 in | Wt 154.1 lb

## 2020-09-23 DIAGNOSIS — R7303 Prediabetes: Secondary | ICD-10-CM

## 2020-09-23 DIAGNOSIS — R1900 Intra-abdominal and pelvic swelling, mass and lump, unspecified site: Secondary | ICD-10-CM | POA: Diagnosis not present

## 2020-09-23 DIAGNOSIS — F4323 Adjustment disorder with mixed anxiety and depressed mood: Secondary | ICD-10-CM

## 2020-09-23 DIAGNOSIS — Z0001 Encounter for general adult medical examination with abnormal findings: Secondary | ICD-10-CM | POA: Insufficient documentation

## 2020-09-23 DIAGNOSIS — Z1211 Encounter for screening for malignant neoplasm of colon: Secondary | ICD-10-CM

## 2020-09-23 DIAGNOSIS — K219 Gastro-esophageal reflux disease without esophagitis: Secondary | ICD-10-CM

## 2020-09-23 DIAGNOSIS — R600 Localized edema: Secondary | ICD-10-CM | POA: Insufficient documentation

## 2020-09-23 DIAGNOSIS — R5383 Other fatigue: Secondary | ICD-10-CM | POA: Insufficient documentation

## 2020-09-23 MED ORDER — FLUOXETINE HCL 20 MG PO CAPS: 20.0000 mg | ORAL_CAPSULE | Freq: Every day | ORAL | 1 refills | Status: DC

## 2020-09-23 MED ORDER — HYDROCHLOROTHIAZIDE 25 MG PO TABS: 25.0000 mg | ORAL_TABLET | Freq: Every day | ORAL | 1 refills | Status: DC

## 2020-09-23 MED ORDER — OMEPRAZOLE 20 MG PO CPDR: 20.0000 mg | DELAYED_RELEASE_CAPSULE | Freq: Every day | ORAL | 1 refills | Status: DC

## 2020-09-23 MED ORDER — BUSPIRONE HCL 10 MG PO TABS: ORAL_TABLET | ORAL | 1 refills | Status: DC

## 2020-09-23 NOTE — Progress Notes (Signed)
Established Patient Office Visit  Subjective:  Patient ID: Leah Jimenez, female    DOB: [birth year]  Age: 55 y.o. MRN: [id number]  CC:  Chief Complaint  Patient presents with   Annual Exam    HPI Leah Jimenez presents for annual health maintenance exam. She states that she is currently doing well. She has noted some swelling of her right foot. This is happening mostly after sitting for long periods of time. Was taking triamterene/HCTZ but has been out for several months. Her blood pressure is well managed. She denies headaches, chest pain, or chest pressure.  She has history of anxiety and depression. At one point, she was taking fluoxetine 20 mg and buspirone on as needed basis. She states that she was able to control acute anxiety much better with these medications. They have been out for a few months and she has managed with coping skills and relaxation techniques. She states that she did manage them better when taking the medications and would like to restart them.  She is due for routine, fasting labs.  She is due for colon cancer screening. She does not have family history of colon cancer. Prefers to do cologuard testing. She needs to have mammogram and pap smear. She does have GYN provider she has seen for many years and plans to make appointment for these preventive tests.   Past Medical History:  Diagnosis Date   Anxiety    [employer]reesia 05/12/2020   Constipation    Depression     Past Surgical History:  Procedure Laterality Date   ENDOMETRIAL ABLATION     EYE SURGERY     TUBAL LIGATION      Family History  Problem Relation Age of Onset   Stroke Mother    High Cholesterol Mother    Hypertension Mother    Stroke Father    High Cholesterol Father    Hypertension Father    High Cholesterol Sister     Social History   Socioeconomic History   Marital status: Single    Spouse name: Not on file   Number of children: Not on file   Years of education: Not on file    Highest education level: Not on file  Occupational History   Not on file  Tobacco Use   Smoking status: Former    Packs/day: 0.25    Years: 11.00    Pack years: 2.75    Types: Cigarettes    Quit date: 06/08/1994    Years since quitting: 26.3   Smokeless tobacco: Never  Substance and Sexual Activity   Alcohol use: Yes    Alcohol/week: 3.0 standard drinks    Types: 3 Cans of beer per week   Drug use: No   Sexual activity: Yes  Other Topics Concern   Not on file  Social History Narrative   Not on file   Social Determinants of Health   Financial Resource Strain: Not on file  Food Insecurity: Not on file  Transportation Needs: Not on file  Physical Activity: Not on file  Stress: Not on file  Social Connections: Not on file  Intimate Partner Violence: Not on file    Outpatient Medications Prior to Visit  Medication Sig Dispense Refill   Vitamin D, Ergocalciferol, (DRISDOL) 1.25 MG (50000 UNIT) CAPS capsule TAKE 1 CAPSULE BY MOUTH EVERY 7 DAYS AS DIRECTED 12 capsule 3   busPIRone (BUSPAR) 10 MG tablet 0.5 tabs po tid for 3wks, then inc to 1 po TID  if tolerated for anxiety 270 tablet 1   FLUoxetine (PROZAC) 20 MG capsule Take 1 capsule (20 mg total) by mouth daily. 90 capsule 1   omeprazole (PRILOSEC) 20 MG capsule Take 1 capsule (20 mg total) by mouth daily. 90 capsule 1   triamterene-hydrochlorothiazide (MAXZIDE) 75-50 MG tablet Take 1 tablet by mouth daily. 90 tablet 1   No facility-administered medications prior to visit.    No Known Allergies  ROS Review of Systems  Constitutional:  Positive for fatigue. Negative for activity change, chills and fever.  HENT:  Negative for congestion, postnasal drip, rhinorrhea and sinus pressure.   Eyes: Negative.   Respiratory:  Negative for cough, chest tightness, shortness of breath and wheezing.   Cardiovascular:  Positive for leg swelling. Negative for chest pain and palpitations.       States that she has noted some mild  swelling in the right foot.   Gastrointestinal:  Negative for abdominal pain, constipation, diarrhea, nausea and vomiting.  Endocrine: Negative for cold intolerance, heat intolerance, polydipsia and polyuria.       States that she has been diagnosed as prediabetic In the past   Genitourinary:  Negative for dysuria, frequency, pelvic pain and urgency.  Musculoskeletal:  Negative for back pain and myalgias.  Skin:  Negative for rash.  Allergic/Immunologic: Positive for environmental allergies.  Neurological:  Negative for dizziness, weakness and headaches.  Hematological: Negative.   Psychiatric/Behavioral:  The patient is nervous/anxious.      Objective:    Physical Exam Vitals and nursing note reviewed.  Constitutional:      Appearance: Normal appearance. She is well-developed.  HENT:     Head: Normocephalic and atraumatic.     Right Ear: Ear canal and external ear normal.     Left Ear: Ear canal and external ear normal.     Nose: Nose normal.     Mouth/Throat:     Mouth: Mucous membranes are moist.     Pharynx: Oropharynx is clear.  Eyes:     Extraocular Movements: Extraocular movements intact.     Conjunctiva/sclera: Conjunctivae normal.     Pupils: Pupils are equal, round, and reactive to light.  Neck:     Vascular: No carotid bruit.  Cardiovascular:     Rate and Rhythm: Normal rate and regular rhythm.     Pulses: Normal pulses.     Heart sounds: Normal heart sounds.  Pulmonary:     Effort: Pulmonary effort is normal.     Breath sounds: Normal breath sounds.  Abdominal:     General: Bowel sounds are normal.     Palpations: Abdomen is soft. There is mass.     Tenderness: There is no abdominal tenderness. There is no guarding or rebound.     Comments: There is smooth, palpable mass in left lower quadrant of the abdomen. It feels round in shape. It is non tender. Measures approximately 8cm in diameter.   Musculoskeletal:        General: Normal range of motion.      Cervical back: Normal range of motion and neck supple.  Lymphadenopathy:     Cervical: No cervical adenopathy.  Skin:    General: Skin is warm and dry.     Capillary Refill: Capillary refill takes less than 2 seconds.  Neurological:     General: No focal deficit present.     Mental Status: She is alert and oriented to person, place, and time.  Psychiatric:  Mood and Affect: Mood normal.        Behavior: Behavior normal.        Thought Content: Thought content normal.        Judgment: Judgment normal.   Today's Vitals   09/23/20 1103  BP: 117/79  Pulse: 82  Temp: 98.5 F (36.9 C)  SpO2: 97%  Weight: 154 lb 1.6 oz (69.9 kg)  Height: 5\' 3"  (1.6 m)   Body mass index is 27.3 kg/m.   Wt Readings from Last 3 Encounters:  09/23/20 154 lb 1.6 oz (69.9 kg)  08/01/19 154 lb (69.9 kg)  06/08/19 160 lb 3.2 oz (72.7 kg)     Health Maintenance Due  Topic Date Due   COVID-19 Vaccine (1) Never done   Hepatitis C Screening  Never done   MAMMOGRAM  Never done   Fecal DNA (Cologuard)  Never done   Zoster Vaccines- Shingrix (1 of 2) Never done   PAP SMEAR-Modifier  09/26/2017    There are no preventive care reminders to display for this patient.  Lab Results  Component Value Date   TSH 2.540 06/28/2017   Lab Results  Component Value Date   WBC 4.6 06/28/2017   HGB 12.7 06/28/2017   HCT 39.7 06/28/2017   MCV 85 06/28/2017   PLT 342 06/28/2017   Lab Results  Component Value Date   NA 145 (H) 06/28/2017   K 3.7 06/28/2017   CO2 29 06/28/2017   GLUCOSE 118 (H) 06/28/2017   BUN 14 06/28/2017   CREATININE 0.96 06/28/2017   BILITOT 0.3 06/28/2017   ALKPHOS 115 06/28/2017   AST 25 06/28/2017   ALT 28 06/28/2017   PROT 6.7 06/28/2017   ALBUMIN 4.2 06/28/2017   CALCIUM 9.6 06/28/2017   Lab Results  Component Value Date   CHOL 238 (H) 06/28/2017   Lab Results  Component Value Date   HDL 80 06/28/2017   Lab Results  Component Value Date   LDLCALC 142 (H)  06/28/2017   Lab Results  Component Value Date   TRIG 78 06/28/2017   Lab Results  Component Value Date   CHOLHDL 3.0 06/28/2017   Lab Results  Component Value Date   HGBA1C 6.4 (H) 06/28/2017      Assessment & Plan:  1. Encounter for general adult medical examination with abnormal findings Annual wellness visit today. Routine, fasting labs obtained at today's visit  - CBC with Differential/Platelet - Comp Met (CMET) - Lipid panel - TSH - T4, free - Hemoglobin A1c  2. Palpable abdominal mass Non tender, palpable mass in left lower quadrant of the abdomen. Abdominal ultrasound ordered for further evaluation.  - US Abdomen Complete; Future  3. Gastroesophageal reflux disease, unspecified whether esophagitis present May take omeprazole 20mg  daily as needed.  - omeprazole (PRILOSEC) 20 MG capsule; Take 1 capsule (20 mg total) by mouth daily.  Dispense: 90 capsule; Refill: 1  4. Pre-diabetes Check routine, fasting labs along with HgbA1c for further evaluation.  - CBC with Differential/Platelet - Comp Met (CMET) - Lipid panel - TSH - T4, free - Hemoglobin A1c  5. Other fatigue Labs ordered and obtained at today's visit  - CBC with Differential/Platelet - Comp Met (CMET) - Lipid panel - TSH - T4, free - Hemoglobin A1c  6. Adjustment disorder with mixed anxiety and depressed mood Restart fluoxetine 20mg  daily. May take buspirone 10mg  up to three times daily as needed for acute anxiety. Refills for both were sent to pharmacy today.  -  busPIRone (BUSPAR) 10 MG tablet; Take 1/2 to 1 tblet po TID prn acute anxiety  Dispense: 270 tablet; Refill: 1 - FLUoxetine (PROZAC) 20 MG capsule; Take 1 capsule (20 mg total) by mouth daily.  Dispense: 90 capsule; Refill: 1  7. Lower extremity edema Start HCTZ $RemoveBe'25mg'EarzJGJCl$  daily as needed  - hydrochlorothiazide (HYDRODIURIL) 25 MG tablet; Take 1 tablet (25 mg total) by mouth daily.  Dispense: 90 tablet; Refill: 1  8. Screening for colon  cancer Order for cologuard to be sent to [facility].  - Cologuard   Problem List Items Addressed This Visit       Digestive   GERD (gastroesophageal reflux disease) (Chronic)   Relevant Medications   omeprazole (PRILOSEC) 20 MG capsule     Other   Pre-diabetes   Relevant Orders   CBC with Differential/Platelet   Comp Met (CMET)   Lipid panel   TSH   T4, free   Hemoglobin A1c   Adjustment disorder with mixed anxiety and depressed mood   Relevant Medications   busPIRone (BUSPAR) 10 MG tablet   FLUoxetine (PROZAC) 20 MG capsule   Encounter for general adult medical examination with abnormal findings - Primary   Relevant Orders   CBC with Differential/Platelet   Comp Met (CMET)   Lipid panel   TSH   T4, free   Hemoglobin A1c   Palpable abdominal mass   Relevant Orders   US Abdomen Complete   Other fatigue   Relevant Orders   CBC with Differential/Platelet   Comp Met (CMET)   Lipid panel   TSH   T4, free   Hemoglobin A1c   Lower extremity edema   Relevant Medications   hydrochlorothiazide (HYDRODIURIL) 25 MG tablet   Screening for colon cancer   Relevant Orders   Cologuard    Meds ordered this encounter  Medications   busPIRone (BUSPAR) 10 MG tablet    Sig: Take 1/2 to 1 tblet po TID prn acute anxiety    Dispense:  270 tablet    Refill:  1    Order Specific Question:   Supervising Provider    Answer:   Beatrice Lecher D [[id number]]   FLUoxetine (PROZAC) 20 MG capsule    Sig: Take 1 capsule (20 mg total) by mouth daily.    Dispense:  90 capsule    Refill:  1    Order Specific Question:   Supervising Provider    Answer:   Beatrice Lecher D [[id number]]   omeprazole (PRILOSEC) 20 MG capsule    Sig: Take 1 capsule (20 mg total) by mouth daily.    Dispense:  90 capsule    Refill:  1    Order Specific Question:   Supervising Provider    Answer:   Beatrice Lecher D [[id number]]   hydrochlorothiazide (HYDRODIURIL) 25 MG tablet    Sig: Take 1 tablet (25 mg  total) by mouth daily.    Dispense:  90 tablet    Refill:  1    Order Specific Question:   Supervising Provider    Answer:   Beatrice Lecher D [[id number]]    Follow-up: Return in about 6 months (around 03/25/2021) for mood - will follow up sooner if needed based on labs and ultrasound results .    Ronnell Freshwater, NP

## 2020-09-24 ENCOUNTER — Other Ambulatory Visit: Payer: Self-pay

## 2020-09-24 LAB — COMPREHENSIVE METABOLIC PANEL
ALT: 29 IU/L (ref 0–32)
AST: 58 IU/L — ABNORMAL HIGH (ref 0–40)
Albumin/Globulin Ratio: 2 (ref 1.2–2.2)
Albumin: 4.7 g/dL (ref 3.8–4.9)
Alkaline Phosphatase: 128 IU/L — ABNORMAL HIGH (ref 44–121)
BUN/Creatinine Ratio: 11 (ref 9–23)
BUN: 9 mg/dL (ref 6–24)
Bilirubin Total: 0.2 mg/dL (ref 0.0–1.2)
CO2: 17 mmol/L — ABNORMAL LOW (ref 20–29)
Calcium: 8.6 mg/dL — ABNORMAL LOW (ref 8.7–10.2)
Chloride: 103 mmol/L (ref 96–106)
Creatinine, Ser: 0.85 mg/dL (ref 0.57–1.00)
Globulin, Total: 2.4 g/dL (ref 1.5–4.5)
Glucose: 83 mg/dL (ref 65–99)
Potassium: 4.3 mmol/L (ref 3.5–5.2)
Sodium: 147 mmol/L — ABNORMAL HIGH (ref 134–144)
Total Protein: 7.1 g/dL (ref 6.0–8.5)
eGFR: 81 mL/min/{1.73_m2} (ref >59)

## 2020-09-24 LAB — CBC WITH DIFFERENTIAL/PLATELET
Basophils Absolute: 0 10*3/uL (ref 0.0–0.2)
Basos: 1 %
EOS (ABSOLUTE): 0.1 10*3/uL (ref 0.0–0.4)
Eos: 2 %
Hematocrit: 40.7 % (ref 34.0–46.6)
Hemoglobin: 13.4 g/dL (ref 11.1–15.9)
Immature Grans (Abs): 0 10*3/uL (ref 0.0–0.1)
Immature Granulocytes: 0 %
Lymphocytes Absolute: 1.7 10*3/uL (ref 0.7–3.1)
Lymphs: 46 %
MCH: 28.8 pg (ref 26.6–33.0)
MCHC: 32.9 g/dL (ref 31.5–35.7)
MCV: 88 fL (ref 79–97)
Monocytes Absolute: 0.2 10*3/uL (ref 0.1–0.9)
Monocytes: 6 %
Neutrophils Absolute: 1.7 10*3/uL (ref 1.4–7.0)
Neutrophils: 45 %
Platelets: 374 10*3/uL (ref 150–450)
RBC: 4.65 x10E6/uL (ref 3.77–5.28)
RDW: 15.6 % — ABNORMAL HIGH (ref 11.7–15.4)
WBC: 3.7 10*3/uL (ref 3.4–10.8)

## 2020-09-24 LAB — LIPID PANEL
Chol/HDL Ratio: 2.5 ratio (ref 0.0–4.4)
Cholesterol, Total: 287 mg/dL — ABNORMAL HIGH (ref 100–199)
HDL: 117 mg/dL (ref >39)
LDL Chol Calc (NIH): 146 mg/dL — ABNORMAL HIGH (ref 0–99)
Triglycerides: 143 mg/dL (ref 0–149)
VLDL Cholesterol Cal: 24 mg/dL (ref 5–40)

## 2020-09-24 LAB — TSH: TSH: 1.31 u[IU]/mL (ref 0.450–4.500)

## 2020-09-24 LAB — HEMOGLOBIN A1C
Est. average glucose Bld gHb Est-mCnc: 120 mg/dL
Hgb A1c MFr Bld: 5.8 % — ABNORMAL HIGH (ref 4.8–5.6)

## 2020-09-24 LAB — T4, FREE: Free T4: 1.24 ng/dL (ref 0.82–1.77)

## 2020-09-25 ENCOUNTER — Telehealth: Payer: Self-pay | Admitting: Nurse Practitioner

## 2020-09-25 ENCOUNTER — Other Ambulatory Visit: Payer: Self-pay | Admitting: Nurse Practitioner

## 2020-09-25 DIAGNOSIS — E559 Vitamin D deficiency, unspecified: Secondary | ICD-10-CM

## 2020-09-25 MED ORDER — VITAMIN D (ERGOCALCIFEROL) 1.25 MG (50000 UNIT) PO CAPS
ORAL_CAPSULE | ORAL | 3 refills | Status: DC
Start: 2020-09-25 — End: 2021-10-20

## 2020-09-25 NOTE — Progress Notes (Signed)
Refilled patient's vitamin d and sent to [facility]

## 2020-09-25 NOTE — Telephone Encounter (Signed)
Patient would like a refill on her Vitamin D medication and uses [facility]. Please advise, thanks.

## 2020-09-25 NOTE — Telephone Encounter (Signed)
We just saw her two days ago. I did not draw a vitamin d level on her. But I did go ahead and fill her vitamin d for now. Thanks  -HB

## 2020-09-30 ENCOUNTER — Encounter: Payer: Self-pay | Admitting: Nurse Practitioner

## 2020-09-30 LAB — COLOGUARD: Cologuard: NEGATIVE

## 2020-09-30 NOTE — Progress Notes (Signed)
Sent MyChart message to inquire about fasting status.

## 2020-10-05 LAB — COLOGUARD: COLOGUARD: NEGATIVE

## 2020-10-13 ENCOUNTER — Encounter: Payer: Self-pay | Admitting: Nurse Practitioner

## 2020-10-14 ENCOUNTER — Ambulatory Visit
Admission: RE | Admit: 2020-10-14 | Discharge: 2020-10-14 | Disposition: A | Discharge status: Home / Self Care | Payer: Managed Care, Other (non HMO) | Source: Ambulatory Visit | Attending: Nurse Practitioner | Admitting: Nurse Practitioner

## 2020-10-14 DIAGNOSIS — R1900 Intra-abdominal and pelvic swelling, mass and lump, unspecified site: Secondary | ICD-10-CM

## 2020-10-19 NOTE — Progress Notes (Signed)
Please let the patient know that ultrasound does show a large uterine fibroid at the area of the palpable mass. Can you ask her if she has a GYN provider? If so, she needs to discuss this with that provider ASAP. If she does not, I need to refer her to GYN for further evaluation and treatment. Thanks.  -HB

## 2021-03-26 ENCOUNTER — Ambulatory Visit: Payer: Managed Care, Other (non HMO) | Admitting: Nurse Practitioner

## 2021-03-31 ENCOUNTER — Ambulatory Visit: Payer: Managed Care, Other (non HMO) | Admitting: Nurse Practitioner

## 2021-07-04 ENCOUNTER — Other Ambulatory Visit: Payer: Self-pay | Admitting: Nurse Practitioner

## 2021-07-04 DIAGNOSIS — K219 Gastro-esophageal reflux disease without esophagitis: Secondary | ICD-10-CM

## 2021-07-04 DIAGNOSIS — F4323 Adjustment disorder with mixed anxiety and depressed mood: Secondary | ICD-10-CM

## 2021-07-04 DIAGNOSIS — R600 Localized edema: Secondary | ICD-10-CM

## 2021-10-19 NOTE — Progress Notes (Signed)
Established patient visit   Patient: Leah Jimenez   DOB: 05-03-1965   56 y.o. Female  MRN: [id number] Visit Date: 10/20/2021   Chief Complaint  Patient presents with   Follow-up   Subjective    HPI  Follow up visit  -elevated blood pressure -- takes HCTZ -mood --taking fluoxetine and does well with this. Does not take buspirone  -due to have routine, fasting labs  -has been seeing GYN provider   --had large uterine fibroid (7.4 cm in diameter) --does not bother her. GYN provider does not want to do anything unless this becomes bothersome . --had mammogram and pap smear.     Medications: Outpatient Medications Prior to Visit  Medication Sig   [DISCONTINUED] busPIRone (BUSPAR) 10 MG tablet Take 1/2 to 1 tblet po TID prn acute anxiety   [DISCONTINUED] FLUoxetine (PROZAC) 20 MG capsule TAKE 1 CAPSULE (20 MG TOTAL) BY MOUTH DAILY.   [DISCONTINUED] hydrochlorothiazide (HYDRODIURIL) 25 MG tablet TAKE 1 TABLET (25 MG TOTAL) BY MOUTH DAILY.   [DISCONTINUED] omeprazole (PRILOSEC) 20 MG capsule TAKE 1 CAPSULE (20 MG TOTAL) BY MOUTH DAILY.   [DISCONTINUED] Vitamin D, Ergocalciferol, (DRISDOL) 1.25 MG (50000 UNIT) CAPS capsule TAKE 1 CAPSULE BY MOUTH EVERY 7 DAYS AS DIRECTED   No facility-administered medications prior to visit.    Review of Systems  Constitutional:  Negative for activity change, appetite change, chills, fatigue and fever.  HENT:  Negative for congestion, postnasal drip, rhinorrhea, sinus pressure, sinus pain, sneezing and sore throat.   Eyes: Negative.   Respiratory:  Negative for cough, chest tightness, shortness of breath and wheezing.   Cardiovascular:  Negative for chest pain and palpitations.       Blood pressure doing well.   Gastrointestinal:  Negative for abdominal pain, constipation, diarrhea, nausea and vomiting.  Endocrine: Negative for cold intolerance, heat intolerance, polydipsia and polyuria.  Genitourinary:  Negative for dyspareunia, dysuria,  flank pain, frequency and urgency.  Musculoskeletal:  Negative for arthralgias, back pain and myalgias.  Skin:  Negative for rash.  Allergic/Immunologic: Negative for environmental allergies.  Neurological:  Negative for dizziness, weakness and headaches.  Hematological:  Negative for adenopathy.  Psychiatric/Behavioral:  Positive for dysphoric mood. The patient is nervous/anxious.        Patient doing well on current dose fluoxetine. Does not feel need for buspirone.        Objective     Today's Vitals   10/20/21 1101 10/20/21 1106  BP: (!) 143/71 138/74  Pulse: 84   SpO2: 95%   Weight: 154 lb 1.9 oz (69.9 kg)   Height: 5' 2.99" (1.6 m)    Body mass index is 27.31 kg/m.   BP Readings from Last 3 Encounters:  10/20/21 138/74  09/23/20 117/79  08/01/19 137/84    Wt Readings from Last 3 Encounters:  10/20/21 154 lb 1.9 oz (69.9 kg)  09/23/20 154 lb 1.6 oz (69.9 kg)  08/01/19 154 lb (69.9 kg)    Physical Exam Vitals and nursing note reviewed.  Constitutional:      Appearance: Normal appearance. She is well-developed.  HENT:     Head: Normocephalic and atraumatic.     Nose: Nose normal.     Mouth/Throat:     Mouth: Mucous membranes are moist.     Pharynx: Oropharynx is clear.  Eyes:     Extraocular Movements: Extraocular movements intact.     Conjunctiva/sclera: Conjunctivae normal.     Pupils: Pupils are equal, round, and reactive to light.  Cardiovascular:     Rate and Rhythm: Normal rate and regular rhythm.     Pulses: Normal pulses.     Heart sounds: Normal heart sounds.  Pulmonary:     Effort: Pulmonary effort is normal.     Breath sounds: Normal breath sounds.  Abdominal:     Palpations: Abdomen is soft.  Musculoskeletal:        General: Normal range of motion.     Cervical back: Normal range of motion and neck supple.  Lymphadenopathy:     Cervical: No cervical adenopathy.  Skin:    General: Skin is warm and dry.     Capillary Refill: Capillary  refill takes less than 2 seconds.  Neurological:     General: No focal deficit present.     Mental Status: She is alert and oriented to person, place, and time.  Psychiatric:        Mood and Affect: Mood normal.        Behavior: Behavior normal.        Thought Content: Thought content normal.        Judgment: Judgment normal.      Results for orders placed or performed in visit on 10/20/21  Comprehensive metabolic panel  Result Value Ref Range   Glucose 93 70 - 99 mg/dL   BUN 9 6 - 24 mg/dL   Creatinine, Ser 0.79 0.57 - 1.00 mg/dL   eGFR 88 >59 mL/min/1.73   BUN/Creatinine Ratio 11 9 - 23   Sodium 148 (H) 134 - 144 mmol/L   Potassium 3.9 3.5 - 5.2 mmol/L   Chloride 106 96 - 106 mmol/L   CO2 24 20 - 29 mmol/L   Calcium 9.3 8.7 - 10.2 mg/dL   Total Protein 7.1 6.0 - 8.5 g/dL   Albumin 4.6 3.8 - 4.9 g/dL   Globulin, Total 2.5 1.5 - 4.5 g/dL   Albumin/Globulin Ratio 1.8 1.2 - 2.2   Bilirubin Total 0.2 0.0 - 1.2 mg/dL   Alkaline Phosphatase 116 44 - 121 IU/L   AST 17 0 - 40 IU/L   ALT 19 0 - 32 IU/L  CBC  Result Value Ref Range   WBC 3.6 3.4 - 10.8 x10E3/uL   RBC 4.80 3.77 - 5.28 x10E6/uL   Hemoglobin 13.2 11.1 - 15.9 g/dL   Hematocrit 39.2 34.0 - 46.6 %   MCV 82 79 - 97 fL   MCH 27.5 26.6 - 33.0 pg   MCHC 33.7 31.5 - 35.7 g/dL   RDW 15.4 11.7 - 15.4 %   Platelets 336 150 - 450 x10E3/uL  Hemoglobin A1c  Result Value Ref Range   Hgb A1c MFr Bld 6.1 (H) 4.8 - 5.6 %   Est. average glucose Bld gHb Est-mCnc 128 mg/dL  Lipid panel  Result Value Ref Range   Cholesterol, Total 270 (H) 100 - 199 mg/dL   Triglycerides 98 0 - 149 mg/dL   HDL 97 >39 mg/dL   VLDL Cholesterol Cal 16 5 - 40 mg/dL   LDL Chol Calc (NIH) 157 (H) 0 - 99 mg/dL   Chol/HDL Ratio 2.8 0.0 - 4.4 ratio  VITAMIN D 25 Hydroxy (Vit-D Deficiency, Fractures)  Result Value Ref Range   Vit D, 25-Hydroxy 83.1 30.0 - 100.0 ng/mL  TSH + free T4  Result Value Ref Range   TSH 1.340 0.450 - 4.500 uIU/mL   Free T4  1.12 0.82 - 1.77 ng/dL    Assessment & Plan    1. Essential  hypertension Stable.  Continue blood pressure medication as prescribed. - Comprehensive metabolic panel; Future - CBC; Future - Comprehensive metabolic panel - CBC  2. Lower extremity edema May continue HCTZ 25 mg daily.  Check routine labs. - hydrochlorothiazide (HYDRODIURIL) 25 MG tablet; Take 1 tablet (25 mg total) by mouth daily.  Dispense: 90 tablet; Refill: 1  3. Mixed hyperlipidemia Check fasting lipid panel and treat high cholesterol as indicated. - Lipid panel; Future - Comprehensive metabolic panel; Future - CBC; Future - Comprehensive metabolic panel - CBC - Lipid panel  4. Gastroesophageal reflux disease, unspecified whether esophagitis present Continue omeprazole 20 mg daily as needed. - omeprazole (PRILOSEC) 20 MG capsule; Take 1 capsule (20 mg total) by mouth daily.  Dispense: 90 capsule; Refill: 1  5. Pre-diabetes Check routine, fasting labs and include hemoglobin A1c. - TSH + free T4; Future - Hemoglobin A1c; Future - Comprehensive metabolic panel; Future - CBC; Future - Comprehensive metabolic panel - CBC - Hemoglobin A1c - TSH + free T4  6. Adjustment disorder with mixed anxiety and depressed mood Stable.  Continue fluoxetine 20 mg daily. - FLUoxetine (PROZAC) 20 MG capsule; Take 1 capsule (20 mg total) by mouth daily.  Dispense: 90 capsule; Refill: 1  7. Vitamin D deficiency Check vitamin D and treat deficiency as indicated. - VITAMIN D 25 Hydroxy (Vit-D Deficiency, Fractures); Future - VITAMIN D 25 Hydroxy (Vit-D Deficiency, Fractures) - Vitamin D, Ergocalciferol, (DRISDOL) 1.25 MG (50000 UNIT) CAPS capsule; TAKE 1 CAPSULE BY MOUTH EVERY 7 DAYS AS DIRECTED  Dispense: 12 capsule; Refill: 3  8. Healthcare maintenance Routine, fasting labs drawn during today's visit.  - TSH + free T4; Future - Hemoglobin A1c; Future - Comprehensive metabolic panel; Future - CBC; Future - Comprehensive  metabolic panel - CBC - Hemoglobin A1c - TSH + free T4    Problem List Items Addressed This Visit       Cardiovascular and Mediastinum   Essential hypertension - Primary (Chronic)   Relevant Medications   hydrochlorothiazide (HYDRODIURIL) 25 MG tablet   Other Relevant Orders   Comprehensive metabolic panel (Completed)   CBC (Completed)     Digestive   GERD (gastroesophageal reflux disease) (Chronic)   Relevant Medications   omeprazole (PRILOSEC) 20 MG capsule     Other   Vitamin D deficiency (Chronic)   Relevant Medications   Vitamin D, Ergocalciferol, (DRISDOL) 1.25 MG (50000 UNIT) CAPS capsule   Other Relevant Orders   VITAMIN D 25 Hydroxy (Vit-D Deficiency, Fractures) (Completed)   HLD: elevated LDL (Chronic)   Relevant Medications   hydrochlorothiazide (HYDRODIURIL) 25 MG tablet   Other Relevant Orders   Lipid panel (Completed)   Comprehensive metabolic panel (Completed)   CBC (Completed)   Pre-diabetes   Relevant Orders   TSH + free T4 (Completed)   Hemoglobin A1c (Completed)   Comprehensive metabolic panel (Completed)   CBC (Completed)   Adjustment disorder with mixed anxiety and depressed mood   Relevant Medications   FLUoxetine (PROZAC) 20 MG capsule   Lower extremity edema   Relevant Medications   hydrochlorothiazide (HYDRODIURIL) 25 MG tablet   Other Visit Diagnoses     Healthcare maintenance       Relevant Orders   TSH + free T4 (Completed)   Hemoglobin A1c (Completed)   Comprehensive metabolic panel (Completed)   CBC (Completed)        Return in about 6 months (around 04/22/2022) for blood pressure, mood.  Ronnell Freshwater, NP  [facility] [phone] (phone) [phone] (fax)  [place]

## 2021-10-20 ENCOUNTER — Encounter: Payer: Self-pay | Admitting: Nurse Practitioner

## 2021-10-20 ENCOUNTER — Ambulatory Visit: Payer: Managed Care, Other (non HMO) | Admitting: Nurse Practitioner

## 2021-10-20 VITALS — BP 138/74 | HR 84 | Ht 62.99 in | Wt 154.1 lb

## 2021-10-20 DIAGNOSIS — E559 Vitamin D deficiency, unspecified: Secondary | ICD-10-CM

## 2021-10-20 DIAGNOSIS — R7303 Prediabetes: Secondary | ICD-10-CM

## 2021-10-20 DIAGNOSIS — E782 Mixed hyperlipidemia: Secondary | ICD-10-CM

## 2021-10-20 DIAGNOSIS — Z0000 Encounter for general adult medical examination without abnormal findings: Secondary | ICD-10-CM

## 2021-10-20 DIAGNOSIS — R600 Localized edema: Secondary | ICD-10-CM

## 2021-10-20 DIAGNOSIS — F4323 Adjustment disorder with mixed anxiety and depressed mood: Secondary | ICD-10-CM

## 2021-10-20 DIAGNOSIS — K219 Gastro-esophageal reflux disease without esophagitis: Secondary | ICD-10-CM

## 2021-10-20 DIAGNOSIS — I10 Essential (primary) hypertension: Secondary | ICD-10-CM | POA: Diagnosis not present

## 2021-10-20 MED ORDER — OMEPRAZOLE 20 MG PO CPDR: 20.0000 mg | DELAYED_RELEASE_CAPSULE | Freq: Every day | ORAL | 1 refills | Status: DC

## 2021-10-20 MED ORDER — HYDROCHLOROTHIAZIDE 25 MG PO TABS: 25.0000 mg | ORAL_TABLET | Freq: Every day | ORAL | 1 refills | Status: DC

## 2021-10-20 MED ORDER — FLUOXETINE HCL 20 MG PO CAPS: 20.0000 mg | ORAL_CAPSULE | Freq: Every day | ORAL | 1 refills | Status: DC

## 2021-10-20 MED ORDER — VITAMIN D (ERGOCALCIFEROL) 1.25 MG (50000 UNIT) PO CAPS: ORAL_CAPSULE | ORAL | 3 refills | Status: AC

## 2021-10-21 LAB — COMPREHENSIVE METABOLIC PANEL
ALT: 19 IU/L (ref 0–32)
AST: 17 IU/L (ref 0–40)
Albumin/Globulin Ratio: 1.8 (ref 1.2–2.2)
Albumin: 4.6 g/dL (ref 3.8–4.9)
Alkaline Phosphatase: 116 IU/L (ref 44–121)
BUN/Creatinine Ratio: 11 (ref 9–23)
BUN: 9 mg/dL (ref 6–24)
Bilirubin Total: 0.2 mg/dL (ref 0.0–1.2)
CO2: 24 mmol/L (ref 20–29)
Calcium: 9.3 mg/dL (ref 8.7–10.2)
Chloride: 106 mmol/L (ref 96–106)
Creatinine, Ser: 0.79 mg/dL (ref 0.57–1.00)
Globulin, Total: 2.5 g/dL (ref 1.5–4.5)
Glucose: 93 mg/dL (ref 70–99)
Potassium: 3.9 mmol/L (ref 3.5–5.2)
Sodium: 148 mmol/L — ABNORMAL HIGH (ref 134–144)
Total Protein: 7.1 g/dL (ref 6.0–8.5)
eGFR: 88 mL/min/{1.73_m2} (ref >59)

## 2021-10-21 LAB — CBC
Hematocrit: 39.2 % (ref 34.0–46.6)
Hemoglobin: 13.2 g/dL (ref 11.1–15.9)
MCH: 27.5 pg (ref 26.6–33.0)
MCHC: 33.7 g/dL (ref 31.5–35.7)
MCV: 82 fL (ref 79–97)
Platelets: 336 10*3/uL (ref 150–450)
RBC: 4.8 x10E6/uL (ref 3.77–5.28)
RDW: 15.4 % (ref 11.7–15.4)
WBC: 3.6 10*3/uL (ref 3.4–10.8)

## 2021-10-21 LAB — LIPID PANEL
Chol/HDL Ratio: 2.8 ratio (ref 0.0–4.4)
Cholesterol, Total: 270 mg/dL — ABNORMAL HIGH (ref 100–199)
HDL: 97 mg/dL (ref >39)
LDL Chol Calc (NIH): 157 mg/dL — ABNORMAL HIGH (ref 0–99)
Triglycerides: 98 mg/dL (ref 0–149)
VLDL Cholesterol Cal: 16 mg/dL (ref 5–40)

## 2021-10-21 LAB — HEMOGLOBIN A1C
Est. average glucose Bld gHb Est-mCnc: 128 mg/dL
Hgb A1c MFr Bld: 6.1 % — ABNORMAL HIGH (ref 4.8–5.6)

## 2021-10-21 LAB — TSH+FREE T4
Free T4: 1.12 ng/dL (ref 0.82–1.77)
TSH: 1.34 u[IU]/mL (ref 0.450–4.500)

## 2021-10-21 LAB — VITAMIN D 25 HYDROXY (VIT D DEFICIENCY, FRACTURES): Vit D, 25-Hydroxy: 83.1 ng/mL (ref 30.0–100.0)

## 2021-11-16 ENCOUNTER — Encounter: Payer: Self-pay | Admitting: Nurse Practitioner

## 2022-01-26 ENCOUNTER — Encounter: Payer: Self-pay | Admitting: Podiatrist

## 2022-01-26 ENCOUNTER — Ambulatory Visit: Payer: Managed Care, Other (non HMO) | Admitting: Podiatry

## 2022-01-26 ENCOUNTER — Ambulatory Visit (INDEPENDENT_AMBULATORY_CARE_PROVIDER_SITE_OTHER): Payer: Managed Care, Other (non HMO)

## 2022-01-26 DIAGNOSIS — M205X2 Other deformities of toe(s) (acquired), left foot: Secondary | ICD-10-CM | POA: Diagnosis not present

## 2022-01-26 DIAGNOSIS — M21619 Bunion of unspecified foot: Secondary | ICD-10-CM

## 2022-01-26 DIAGNOSIS — M109 Gout, unspecified: Secondary | ICD-10-CM

## 2022-01-26 NOTE — Progress Notes (Signed)
  Subjective:  Patient ID: Leah Jimenez, female    DOB: 07-31-1965,  MRN: [id number]  Chief Complaint  Patient presents with   Bunions    L foot pain due to bunion, pain started around 3 weeks ago. Has had bunion surgery on the right foot    56 y.o. female presents with the above complaint. History confirmed with patient.  Its been very swollen and painful for her.  Objective:  Physical Exam: warm, good capillary refill, no trophic changes or ulcerative lesions, normal DP and PT pulses, normal sensory exam, and well-healed surgical scar on the right foot with good range of motion of joint, left foot has prominent dorsal and medial first metatarsal head spurring with edema and pain and tenderness and warmth.   Radiographs: Multiple views x-ray of the left foot: No significant increased intermetatarsal angle there is dorsal and medial spurring with first ray elevatus of the first metatarsal Assessment:   1. Bunion   2. Gouty arthritis of left great toe   3. Hallux limitus, left      Plan:  Patient was evaluated and treated and all questions answered.  We reviewed her x-rays in detail.  Discussed with her that she does have contributing factors of the first met elevatus as well as dorsal and medial spurring.  Clinically her foot had a much larger soft tissue swelling and joint effusion noted.  My chief concern would be for gouty arthritis and I recommended checking an arthritis panel to evaluate for this.  [facility] will do this at [facility].  I also recommended corticosteroid injection and aspiration of the joint.  Following sterile prep with Betadine and a local field block with 1.5 cc each of 2% lidocaine and 0.5% Marcaine plain, the left first MPJ was aspirated with an 18-gauge needle and syringe, no significant fluid aspiration was able to be completed.  I did inject 20 mg of Kenalog directly into the joint.  She tolerated as well as dressed with a bandage.  I will see her back in 1 month for  follow-up, if negative for gout and still symptomatic then likely would recommend cheilectomy plus or minus Cotton osteotomy  Return in about 1 month (around 02/26/2022) for after lab work to review, follow up on left foot bunion (no xrays).

## 2022-02-23 ENCOUNTER — Ambulatory Visit: Payer: Managed Care, Other (non HMO) | Admitting: Podiatry

## 2022-03-23 ENCOUNTER — Ambulatory Visit: Payer: Managed Care, Other (non HMO) | Admitting: Podiatry

## 2022-04-06 ENCOUNTER — Ambulatory Visit: Payer: Managed Care, Other (non HMO) | Admitting: Podiatry

## 2022-04-20 ENCOUNTER — Ambulatory Visit: Payer: Managed Care, Other (non HMO) | Admitting: Nurse Practitioner

## 2022-05-24 NOTE — Progress Notes (Signed)
Established patient visit   Patient: Leah Jimenez   DOB: [birth year]   57 y.o. Female  MRN: [id number] Visit Date: 05/25/2022   Chief Complaint  Patient presents with   Follow-up   Hypertension   Subjective    HPI  Follow up  -elevated blood pressure -- takes HCTZ --generally well controlled but elevated at the start of the visit.  -mood --taking fluoxetine and does well with this. Having some numbness and shooting pains in the pinky and ring fingers of the right hand.  --first noted about a month ago. Gradually getting worse. Concern for nerve impingement of ulnar nerve.  --she does take tylenol for this.  -grip strength and ROM is intact.  -hurts more when she is typing. 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  -stretching out the arm does seem to make the discomfort better.  -She denies chest pain, chest pressure, or shortness of breath. She denies headaches or visual disturbances. She denies abdominal pain, nausea, vomiting, or changes in bowel or bladder habits.      Medications: Outpatient Medications Prior to Visit  Medication Sig   Vitamin D, Ergocalciferol, (DRISDOL) 1.25 MG (50000 UNIT) CAPS capsule TAKE 1 CAPSULE BY MOUTH EVERY 7 DAYS AS DIRECTED   [DISCONTINUED] FLUoxetine (PROZAC) 20 MG capsule Take 1 capsule (20 mg total) by mouth daily.   [DISCONTINUED] hydrochlorothiazide (HYDRODIURIL) 25 MG tablet Take 1 tablet (25 mg total) by mouth daily.   [DISCONTINUED] omeprazole (PRILOSEC) 20 MG capsule Take 1 capsule (20 mg total) by mouth daily.   No facility-administered medications prior to visit.    Review of Systems See HPI     Last CBC Lab Results  Component Value Date   WBC 3.6 10/20/2021   HGB 13.2 10/20/2021   HCT 39.2 10/20/2021   MCV 82 10/20/2021   MCH 27.5 10/20/2021   RDW 15.4 10/20/2021   PLT 336 [id number]   Last metabolic panel Lab Results  Component Value Date   GLUCOSE 93 10/20/2021   NA 148 (H) 10/20/2021   K 3.9 10/20/2021   CL  106 10/20/2021   CO2 24 10/20/2021   BUN 9 10/20/2021   CREATININE 0.79 10/20/2021   EGFR 88 10/20/2021   CALCIUM 9.3 10/20/2021   PHOS 3.3 06/28/2017   PROT 7.1 10/20/2021   ALBUMIN 4.6 10/20/2021   LABGLOB 2.5 10/20/2021   AGRATIO 1.8 10/20/2021   BILITOT 0.2 10/20/2021   ALKPHOS 116 10/20/2021   AST 17 10/20/2021   ALT 19 10/20/2021   Last lipids Lab Results  Component Value Date   CHOL 270 (H) 10/20/2021   HDL 97 10/20/2021   LDLCALC 157 (H) 10/20/2021   TRIG 98 10/20/2021   CHOLHDL 2.8 10/20/2021   Last hemoglobin A1c Lab Results  Component Value Date   HGBA1C 6.1 (H) 10/20/2021   Last thyroid functions Lab Results  Component Value Date   TSH 1.340 10/20/2021   Last vitamin D Lab Results  Component Value Date   VD25OH 83.1 10/20/2021       Objective     Today's Vitals   05/25/22 1056 05/25/22 1126  BP: (Abnormal) 144/85 123/73  Pulse: 77   SpO2: 96%   Weight: 163 lb 12.8 oz (74.3 kg)   Height: 5' 2.99" (1.6 m)    Body mass index is 29.03 kg/m.  BP Readings from Last 3 Encounters:  05/25/22 123/73  10/20/21 138/74  09/23/20 117/79    Wt Readings from Last 3 Encounters:  05/25/22 163 lb 12.8 oz (74.3 kg)  10/20/21 154 lb 1.9 oz (69.9 kg)  09/23/20 154 lb 1.6 oz (69.9 kg)    Physical Exam Vitals and nursing note reviewed.  Constitutional:      Appearance: Normal appearance. She is well-developed.  HENT:     Head: Normocephalic and atraumatic.     Nose: Nose normal.     Mouth/Throat:     Mouth: Mucous membranes are moist.     Pharynx: Oropharynx is clear.  Eyes:     Extraocular Movements: Extraocular movements intact.     Conjunctiva/sclera: Conjunctivae normal.     Pupils: Pupils are equal, round, and reactive to light.  Cardiovascular:     Rate and Rhythm: Normal rate and regular rhythm.     Pulses: Normal pulses.     Heart sounds: Normal heart sounds.  Pulmonary:     Effort: Pulmonary effort is normal.     Breath sounds: Normal  breath sounds.  Abdominal:     Palpations: Abdomen is soft.  Musculoskeletal:        General: Normal range of motion.     Cervical back: Normal range of motion and neck supple.     Comments: Tenderness of bilateral wrists most severe with typing mechanism and with supination and pronation of the hand. Grip strength and ROM of hand and fingers are within normal limits.   Lymphadenopathy:     Cervical: No cervical adenopathy.  Skin:    General: Skin is warm and dry.     Capillary Refill: Capillary refill takes less than 2 seconds.  Neurological:     General: No focal deficit present.     Mental Status: She is alert and oriented to person, place, and time.  Psychiatric:        Mood and Affect: Mood normal.        Behavior: Behavior normal.        Thought Content: Thought content normal.        Judgment: Judgment normal.       Assessment & Plan    1. Ulnar nerve compression, right .recommend bilateral wrist splints to wear at night. Apply ice to wrists after work in 20 minute intervals. She should take tylenol and/or ibuprofen as needed for pain. Refer to orthopedics for further evaluation,   2. Ulnar nerve compression, left .recommend bilateral wrist splints to wear at night. Apply ice to wrists after work in 20 minute intervals. She should take tylenol and/or ibuprofen as needed for pain. Refer to orthopedics for further evaluation,  - Ambulatory referral to Orthopedics  3. Essential hypertension Stable. Continue bp medication as prescribed   4. Need for influenza vaccination Flu vaccine administered during today's visit.  - Flu Vaccine QUAD 6+ mos PF IM (Fluarix Quad PF)    Problem List Items Addressed This Visit       Cardiovascular and Mediastinum   Essential hypertension (Chronic)     Nervous and Auditory   Ulnar nerve compression, left   Relevant Orders   Ambulatory referral to Orthopedics   Ulnar nerve compression, right - Primary   Other Visit Diagnoses      Need for influenza vaccination       Relevant Orders   Flu Vaccine QUAD 6+ mos PF IM (Fluarix Quad PF) (Completed)        Return in about 6 months (around 11/23/2022) for health maintenance exam, FBW a week prior to visit.  Ronnell Freshwater, NP  [facility] at [facility] [phone] (phone) [phone] (fax)  [place]

## 2022-05-25 ENCOUNTER — Ambulatory Visit: Payer: Managed Care, Other (non HMO) | Admitting: Nurse Practitioner

## 2022-05-25 ENCOUNTER — Encounter: Payer: Self-pay | Admitting: Nurse Practitioner

## 2022-05-25 VITALS — BP 123/73 | HR 77 | Ht 62.99 in | Wt 163.8 lb

## 2022-05-25 DIAGNOSIS — G5621 Lesion of ulnar nerve, right upper limb: Secondary | ICD-10-CM | POA: Diagnosis not present

## 2022-05-25 DIAGNOSIS — I10 Essential (primary) hypertension: Secondary | ICD-10-CM | POA: Diagnosis not present

## 2022-05-25 DIAGNOSIS — G5622 Lesion of ulnar nerve, left upper limb: Secondary | ICD-10-CM

## 2022-05-25 DIAGNOSIS — Z23 Encounter for immunization: Secondary | ICD-10-CM | POA: Diagnosis not present

## 2022-06-24 ENCOUNTER — Other Ambulatory Visit: Payer: Self-pay | Admitting: Nurse Practitioner

## 2022-06-24 DIAGNOSIS — K219 Gastro-esophageal reflux disease without esophagitis: Secondary | ICD-10-CM

## 2022-06-24 DIAGNOSIS — R600 Localized edema: Secondary | ICD-10-CM

## 2022-06-24 DIAGNOSIS — G5622 Lesion of ulnar nerve, left upper limb: Secondary | ICD-10-CM | POA: Insufficient documentation

## 2022-06-24 DIAGNOSIS — G5621 Lesion of ulnar nerve, right upper limb: Secondary | ICD-10-CM | POA: Insufficient documentation

## 2022-06-24 DIAGNOSIS — F4323 Adjustment disorder with mixed anxiety and depressed mood: Secondary | ICD-10-CM

## 2022-07-16 IMAGING — US US PELVIS LIMITED
1 series · 12 of 12 positions shown · non-contrast
Comparison: None.

CLINICAL DATA: Left lower abdominal wall mass for several years.

EXAM:
US PELVIS LIMITED
TECHNIQUE: Ultrasound examination of the pelvic soft tissues was performed in
the area of clinical concern.

[Series 1: us pelvis limited · 0.17mm/px · 12 acquisitions, 12 frames shown]
[im 1/12]
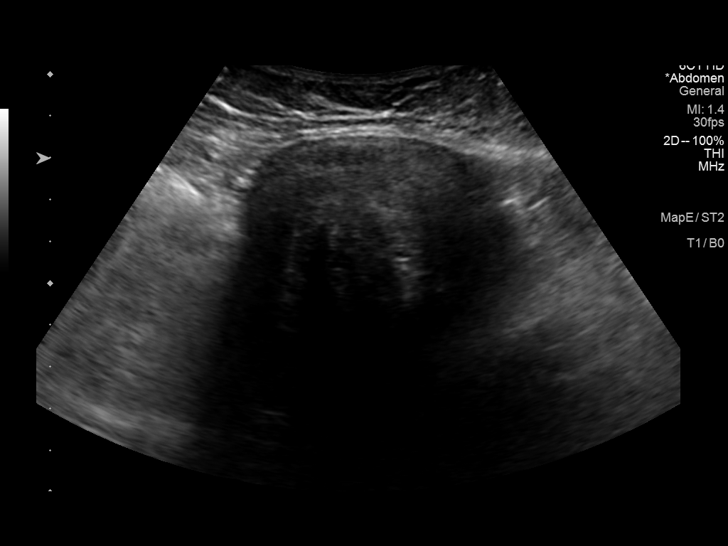
[im 2/12]
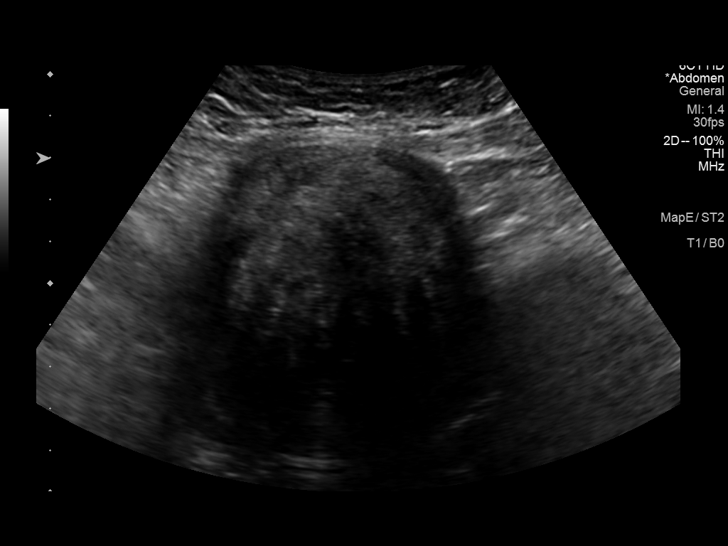
[im 3/12]
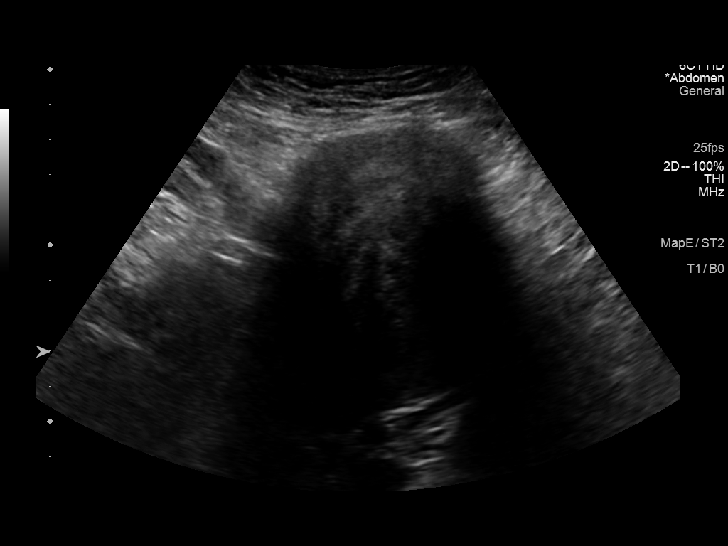
[im 4/12]
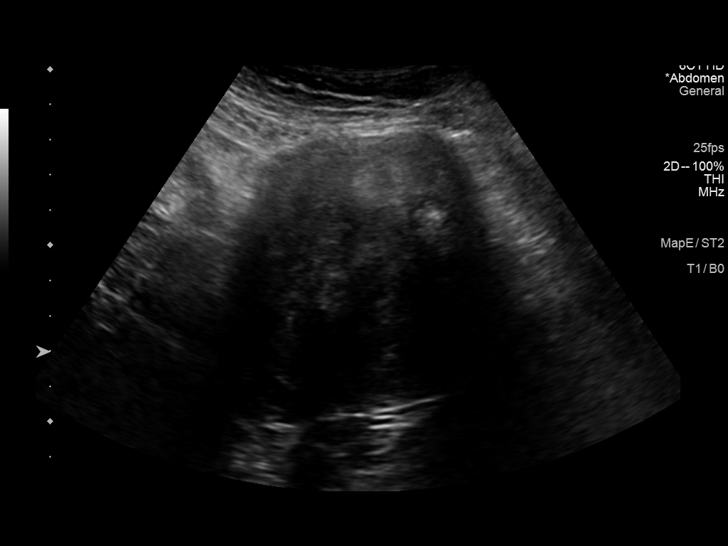
[im 5/12]
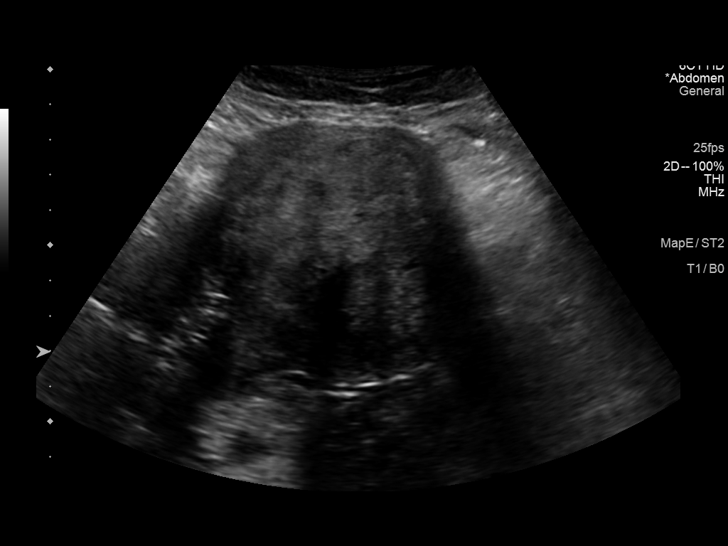
[im 6/12]
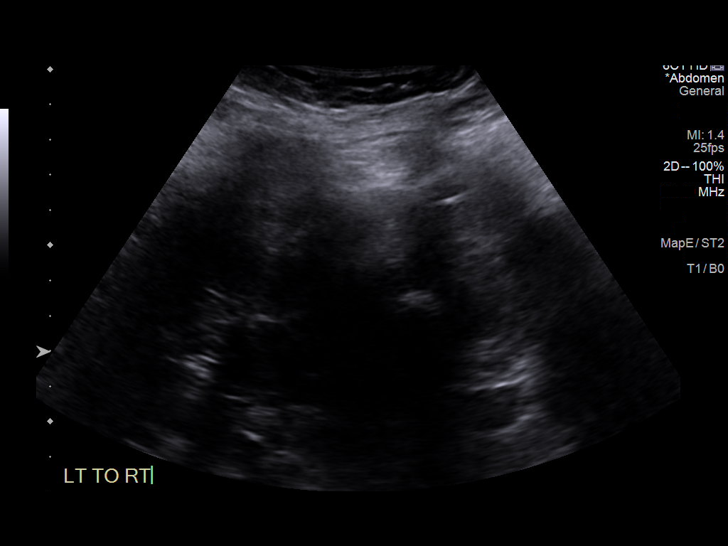
[im 7/12]
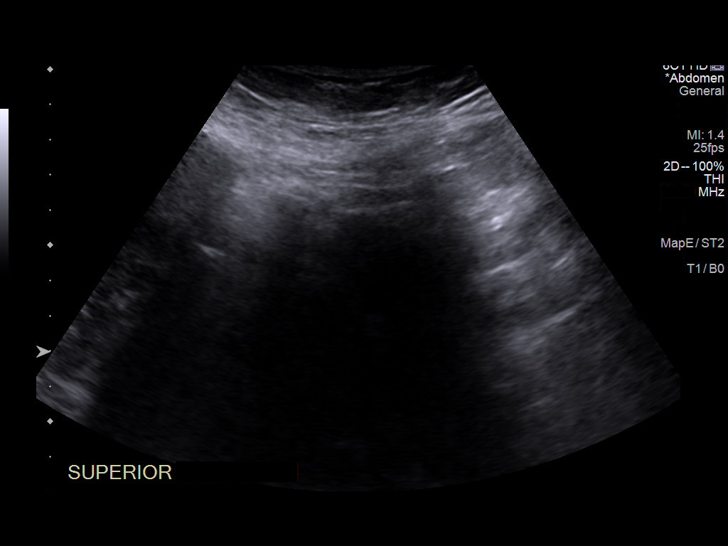
[im 8/12]
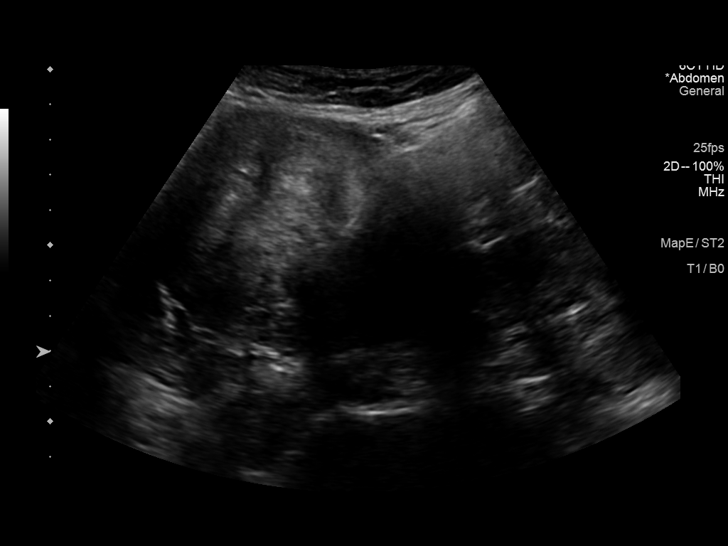
[im 9/12]
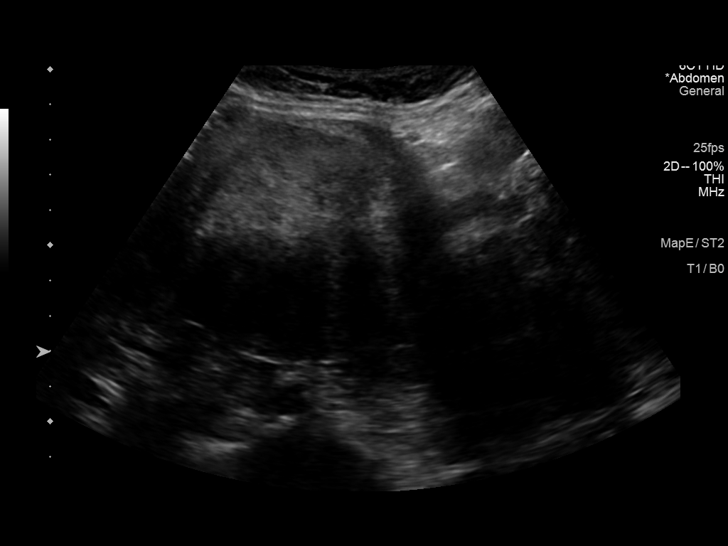
[im 10/12]
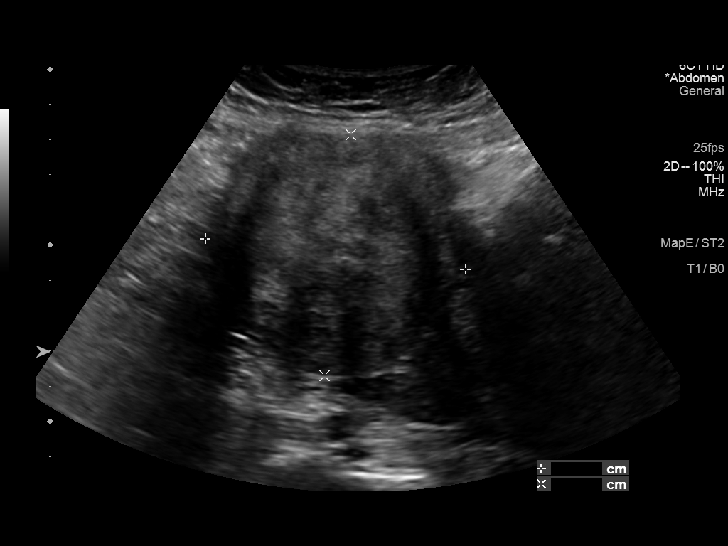
[im 11/12]
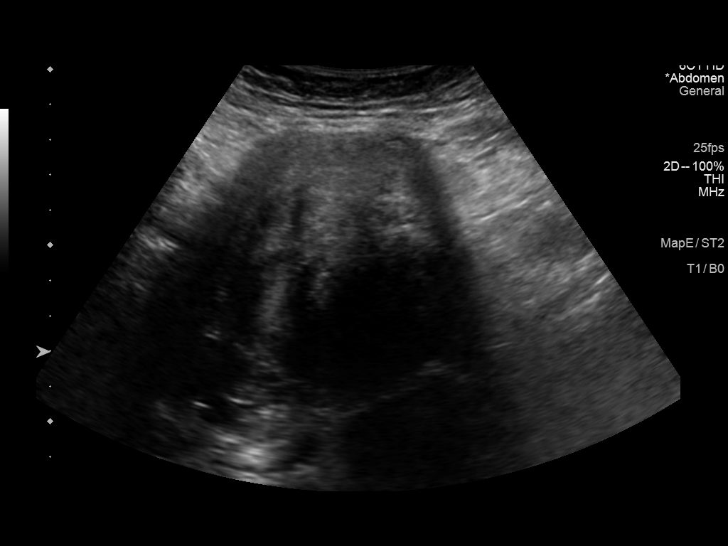
[im 12/12]
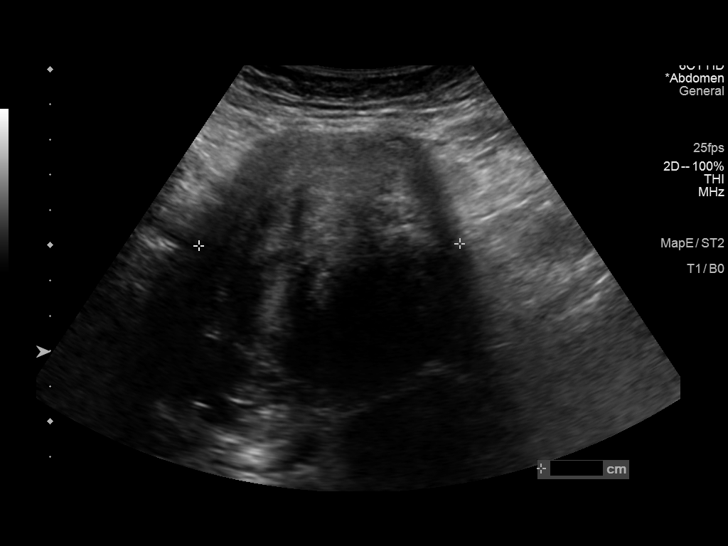

[12 of 12 positions shown; findings below may reference images not displayed]

FINDINGS: Focused ultrasound of the palpable abnormality in the left lower
abdominal wall slightly below the umbilicus demonstrates a large
x 6.9 x 7.4 cm uterine fibroid.
IMPRESSION: 1. Palpable abnormality corresponds to a large 7.4 cm uterine
fibroid.

## 2022-09-24 ENCOUNTER — Ambulatory Visit
Admission: EM | Admit: 2022-09-24 | Discharge: 2022-09-24 | Disposition: A | Discharge status: Home / Self Care | Payer: Managed Care, Other (non HMO) | Attending: Family Medicine | Admitting: Family Medicine

## 2022-09-24 DIAGNOSIS — J019 Acute sinusitis, unspecified: Secondary | ICD-10-CM | POA: Diagnosis not present

## 2022-09-24 DIAGNOSIS — R059 Cough, unspecified: Secondary | ICD-10-CM

## 2022-09-24 DIAGNOSIS — J392 Other diseases of pharynx: Secondary | ICD-10-CM

## 2022-09-24 MED ORDER — BENZONATATE 100 MG PO CAPS: 200.0000 mg | ORAL_CAPSULE | Freq: Three times a day (TID) | ORAL | 0 refills | Status: AC | PRN

## 2022-09-24 MED ORDER — AMOXICILLIN 875 MG PO TABS: 875.0000 mg | ORAL_TABLET | Freq: Two times a day (BID) | ORAL | 0 refills | Status: AC

## 2022-09-24 NOTE — ED Provider Notes (Signed)
[facility]    CSN: [id number] Arrival date & time: 09/24/22  9562      History   Chief Complaint Chief Complaint  Patient presents with   Sore Throat    HPI Leah Jimenez is a 57 y.o. female.   HPI Patient presents today for evaluation of sore throat x 3 weeks. Reports associated symptoms of sinus congestion, runny nose, facial pressure, and post nasal drainage.  Patient has a history of chronic allergic rhinitis. She has attempted relief with warm tea and gargling with warm salt water without relief of symptoms. Denies SOB, productive cough, fever, or body aches. No known sick exposure,   Past Medical History:  Diagnosis Date   Anxiety    [employer] 05/12/2020   Constipation    Depression     Patient Active Problem List   Diagnosis Date Noted   Ulnar nerve compression, left 06/24/2022   Ulnar nerve compression, right 06/24/2022   Encounter for general adult medical examination with abnormal findings 09/23/2020   Palpable abdominal mass 09/23/2020   Other fatigue 09/23/2020   Lower extremity edema 09/23/2020   Screening for colon cancer 09/23/2020   Uncontrolled REM sleep behavior disorder-   "thrashes around at night" -very disruptive sleep 08/01/2019   H/O noncompliance with medical treatment, presenting hazards to health 06/10/2019   Mood disorder (HCC) 11/18/2018   Adjustment disorder with mixed anxiety and depressed mood 11/18/2018   Panic attack as reaction to stress 11/18/2018   Loss of job 11/18/2018   Loud snoring 12/28/2017   Excessive daytime sleepiness 12/28/2017   GERD (gastroesophageal reflux disease) 01/30/2016   Pre-diabetes 10/22/2015   Vitamin D deficiency 10/22/2015   Essential hypertension 10/22/2015   HLD: elevated LDL 10/22/2015   Overweight (BMI 25.0-29.9) 10/01/2015   OSA on CPAP 03/06/2009   ALLERGIC RHINITIS 12/26/2008   HYPERSOMNIA 12/26/2008   Dysthymia 12/15/2008   Constipation 12/13/2008   RECTAL BLEEDING  12/13/2008    Past Surgical History:  Procedure Laterality Date   ENDOMETRIAL ABLATION     EYE SURGERY     TUBAL LIGATION      OB History   No obstetric history on file.      Home Medications    Prior to Admission medications   Medication Sig Start Date End Date Taking? Authorizing Provider  amoxicillin (AMOXIL) 875 MG tablet Take 1 tablet (875 mg total) by mouth 2 (two) times daily. 09/24/22  Yes Bing Neighbors, NP  benzonatate (TESSALON) 100 MG capsule Take 2 capsules (200 mg total) by mouth 3 (three) times daily as needed for cough. 09/24/22  Yes Bing Neighbors, NP  FLUoxetine (PROZAC) 20 MG capsule TAKE 1 CAPSULE (20 MG TOTAL) BY MOUTH DAILY. 06/24/22   Carlean Jews, NP  hydrochlorothiazide (HYDRODIURIL) 25 MG tablet TAKE 1 TABLET (25 MG TOTAL) BY MOUTH DAILY. 06/24/22   Carlean Jews, NP  omeprazole (PRILOSEC) 20 MG capsule TAKE 1 CAPSULE (20 MG TOTAL) BY MOUTH DAILY. 06/24/22   Carlean Jews, NP  Vitamin D, Ergocalciferol, (DRISDOL) 1.25 MG (50000 UNIT) CAPS capsule TAKE 1 CAPSULE BY MOUTH EVERY 7 DAYS AS DIRECTED 10/20/21   Carlean Jews, NP    Family History Family History  Problem Relation Age of Onset   Stroke Mother    High Cholesterol Mother    Hypertension Mother    Stroke Father    High Cholesterol Father    Hypertension Father    High Cholesterol Sister  Social History Social History   Tobacco Use   Smoking status: Former    Packs/day: 0.25    Years: 11.00    Additional pack years: 0.00    Total pack years: 2.75    Types: Cigarettes    Quit date: 06/08/1994    Years since quitting: 28.3   Smokeless tobacco: Never  Substance Use Topics   Alcohol use: Yes    Alcohol/week: 3.0 standard drinks of alcohol    Types: 3 Cans of beer per week   Drug use: No     Allergies   Patient has no known allergies.   Review of Systems Review of Systems Pertinent negatives listed in HPI  Physical Exam Triage Vital Signs ED Triage  Vitals  Enc Vitals Group     BP 09/24/22 0950 (!) 166/98     Pulse Rate 09/24/22 0950 65     Resp 09/24/22 0950 18     Temp 09/24/22 0950 98.2 F (36.8 C)     Temp Source 09/24/22 0950 Oral     SpO2 09/24/22 0950 96 %     Weight --      Height --      Head Circumference --      Peak Flow --      Pain Score 09/24/22 0951 0     Pain Loc --      Pain Edu? --      Excl. in GC? --    No data found.  Updated Vital Signs BP (!) 166/98 (BP Location: Right Arm)   Pulse 65   Temp 98.2 F (36.8 C) (Oral)   Resp 18   SpO2 96%   Visual Acuity Right Eye Distance:   Left Eye Distance:   Bilateral Distance:    Right Eye Near:   Left Eye Near:    Bilateral Near:     Physical Exam Vitals reviewed.  Constitutional:      Appearance: Normal appearance.  HENT:     Head: Normocephalic and atraumatic.     Nose: Nasal tenderness, mucosal edema, congestion and rhinorrhea present. Rhinorrhea is purulent.     Right Turbinates: Enlarged.     Left Turbinates: Enlarged.     Mouth/Throat:     Pharynx: Posterior oropharyngeal erythema present.     Tonsils: 2+ on the right. 2+ on the left.  Eyes:     Extraocular Movements: Extraocular movements intact.     Conjunctiva/sclera: Conjunctivae normal.     Pupils: Pupils are equal, round, and reactive to light.  Cardiovascular:     Rate and Rhythm: Normal rate and regular rhythm.  Pulmonary:     Effort: Pulmonary effort is normal.     Breath sounds: Normal breath sounds.  Skin:    General: Skin is warm.  Neurological:     General: No focal deficit present.     Mental Status: She is alert.      UC Treatments / Results  Labs (all labs ordered are listed, but only abnormal results are displayed) Labs Reviewed - No data to display  EKG   Radiology No results found.  Procedures Procedures (including critical care time)  Medications Ordered in UC Medications - No data to display  Initial Impression / Assessment and Plan / UC  Course  I have reviewed the triage vital signs and the nursing notes.  Pertinent labs & imaging results that were available during my care of the patient were reviewed by me and considered in my medical  decision making (see chart for details).    Acute non-recurrent sinusitis, unspecified location Start Amoxicillin 875 mg tin 1 tablet every 12 hours x 10 days.  Post nasal drainage-likely cause of sore throat, deferred strep given Amoxicillin will cover bacterial pharyngitis.   Take Benzonatate 400 mg  3 times daily as needed for cough.  Hydrate well with fluids. Return precautions given if symptoms worsen or do not resolve. Final Clinical Impressions(s) / UC Diagnoses   Final diagnoses:  Acute non-recurrent sinusitis, unspecified location  Cough in adult  Throat irritation     Discharge Instructions      Take medication as prescribed.  Continue throat lozenges as needed.  Hydrate well with fluids.  Return as needed.     ED Prescriptions     Medication Sig Dispense Auth. Provider   amoxicillin (AMOXIL) 875 MG tablet Take 1 tablet (875 mg total) by mouth 2 (two) times daily. 20 tablet Bing Neighbors, NP   benzonatate (TESSALON) 100 MG capsule Take 2 capsules (200 mg total) by mouth 3 (three) times daily as needed for cough. 21 capsule Bing Neighbors, NP      PDMP not reviewed this encounter.   Bing Neighbors, NP 09/27/22 2043

## 2022-09-24 NOTE — Discharge Instructions (Signed)
Take medication as prescribed.  Continue throat lozenges as needed.  Hydrate well with fluids.  Return as needed.

## 2022-09-24 NOTE — ED Triage Notes (Signed)
Patient with scratchy throat for about 3 weeks. States she has tried hot tea and salt water gargles with no relief.

## 2022-12-04 ENCOUNTER — Other Ambulatory Visit: Payer: Self-pay | Admitting: Nurse Practitioner

## 2022-12-04 DIAGNOSIS — E559 Vitamin D deficiency, unspecified: Secondary | ICD-10-CM

## 2022-12-07 ENCOUNTER — Other Ambulatory Visit: Payer: Self-pay | Admitting: Family Medicine

## 2022-12-07 DIAGNOSIS — E559 Vitamin D deficiency, unspecified: Secondary | ICD-10-CM

## 2023-01-13 ENCOUNTER — Other Ambulatory Visit: Payer: Self-pay

## 2023-01-13 DIAGNOSIS — E782 Mixed hyperlipidemia: Secondary | ICD-10-CM

## 2023-01-13 DIAGNOSIS — Z0000 Encounter for general adult medical examination without abnormal findings: Secondary | ICD-10-CM

## 2023-01-13 DIAGNOSIS — R7303 Prediabetes: Secondary | ICD-10-CM

## 2023-01-13 DIAGNOSIS — I10 Essential (primary) hypertension: Secondary | ICD-10-CM

## 2023-01-17 ENCOUNTER — Other Ambulatory Visit: Payer: Managed Care, Other (non HMO)

## 2023-01-24 ENCOUNTER — Encounter: Payer: Managed Care, Other (non HMO) | Admitting: Nurse Practitioner

## 2023-01-26 ENCOUNTER — Encounter: Payer: Managed Care, Other (non HMO) | Admitting: Family Medicine
